# Patient Record
Sex: Female | Born: 1950 | Race: White | Hispanic: No | Smoking: Former smoker
Health system: Southern US, Community
[De-identification: ages and names within clinical notes are randomized; demographics above are authoritative.]

## PROBLEM LIST (undated history)

## (undated) DIAGNOSIS — E785 Hyperlipidemia, unspecified: Secondary | ICD-10-CM

## (undated) DIAGNOSIS — J449 Chronic obstructive pulmonary disease, unspecified: Secondary | ICD-10-CM

## (undated) DIAGNOSIS — R0600 Dyspnea, unspecified: Secondary | ICD-10-CM

## (undated) DIAGNOSIS — F329 Major depressive disorder, single episode, unspecified: Secondary | ICD-10-CM

## (undated) DIAGNOSIS — F32A Depression, unspecified: Secondary | ICD-10-CM

## (undated) DIAGNOSIS — Z9289 Personal history of other medical treatment: Secondary | ICD-10-CM

## (undated) DIAGNOSIS — F419 Anxiety disorder, unspecified: Secondary | ICD-10-CM

## (undated) DIAGNOSIS — E7800 Pure hypercholesterolemia, unspecified: Secondary | ICD-10-CM

## (undated) DIAGNOSIS — M1990 Unspecified osteoarthritis, unspecified site: Secondary | ICD-10-CM

## (undated) HISTORY — DX: Depression, unspecified: F32.A

## (undated) HISTORY — PX: REPLACEMENT TOTAL KNEE: SUR1224

## (undated) HISTORY — PX: CORRECTION HAMMER TOE: SUR317

## (undated) HISTORY — DX: Personal history of other medical treatment: Z92.89

## (undated) HISTORY — DX: Hyperlipidemia, unspecified: E78.5

## (undated) HISTORY — PX: VAGINAL HYSTERECTOMY: SUR661

## (undated) HISTORY — DX: Unspecified osteoarthritis, unspecified site: M19.90

---
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
## 2004-10-05 ENCOUNTER — Ambulatory Visit: Payer: Self-pay | Admitting: Cardiology

## 2004-10-13 ENCOUNTER — Ambulatory Visit: Payer: Self-pay | Admitting: Cardiology

## 2004-10-18 ENCOUNTER — Inpatient Hospital Stay (HOSPITAL_BASED_OUTPATIENT_CLINIC_OR_DEPARTMENT_OTHER): Admission: RE | Admit: 2004-10-18 | Discharge: 2004-10-18 | Payer: Self-pay | Admitting: Cardiology

## 2004-10-18 ENCOUNTER — Ambulatory Visit: Payer: Self-pay | Admitting: Cardiology

## 2004-10-29 ENCOUNTER — Ambulatory Visit: Payer: Self-pay | Admitting: Cardiology

## 2007-07-09 ENCOUNTER — Encounter: Payer: Self-pay | Admitting: Orthopedic Surgery

## 2007-07-09 ENCOUNTER — Ambulatory Visit: Payer: Self-pay | Admitting: Orthopedic Surgery

## 2007-07-09 DIAGNOSIS — M1710 Unilateral primary osteoarthritis, unspecified knee: Secondary | ICD-10-CM | POA: Insufficient documentation

## 2007-07-09 DIAGNOSIS — IMO0002 Reserved for concepts with insufficient information to code with codable children: Secondary | ICD-10-CM | POA: Insufficient documentation

## 2009-09-19 HISTORY — PX: REPLACEMENT TOTAL KNEE: SUR1224

## 2010-01-28 ENCOUNTER — Inpatient Hospital Stay (HOSPITAL_COMMUNITY): Admission: RE | Admit: 2010-01-28 | Discharge: 2010-01-30 | Payer: Self-pay | Admitting: Orthopedic Surgery

## 2010-10-19 NOTE — Assessment & Plan Note (Signed)
Vital Signs:  Patient Profile:   60 Years Old Female Weight:      165 pounds Temp:     98.7 degrees F Pulse rate:   78 / minute Resp:     20 per minute                 Chief Complaint:  bilateral knee pain.  History of Present Illness: I saw Victoria Adkins in the office today for an initial visit.  She is a 60 years old woman with the complaint of:  bilateral knee pain and swelling. Had surgery 1975 of rt knee due to torn cartlidge and torn ligaments. States she has to get both knees aspirated alot due to swelling. She has had this problem for over 20 years. No recent xrays. We will get xrays today in our office. She states she has alot of stiffness; has seen Dr. Arletha Grippe for these problems for over 20 yrs due to her osteoarthritis; Does not take any medicine for pain. Pain is 8 at worse, no pain now.  Summation 60 yo female s/p open right medial menisectomy  (1970's), with bilateral intermittent knee pain.  Current Allergies (reviewed today): No known allergies  Updated/Current Medications (including changes made in today's visit):  * PREMARIN 1.25MG   * EFFEXOR 150MG     Past Medical History:    Reviewed history and no changes required:       menopause       hot flashes  Past Surgical History:    Reviewed history and no changes required:       hysterectomy       rt knee    Family History:    Family History of Arthritis  Social History:    Patient is divorced.    Risk Factors:  Tobacco use:  never Alcohol use:  yes    Type:  beer    Comments:  socially   Review of Systems  The patient denies anorexia, fever, weight loss, vision loss, decreased hearing, hoarseness, chest pain, syncope, dyspnea on exhertion, peripheral edema, prolonged cough, hemoptysis, abdominal pain, melena, hematochezia, severe indigestion/heartburn, hematuria, incontinence, genital sores, muscle weakness, suspicious skin lesions, transient blindness, difficulty walking, unusual weight  change, abnormal bleeding, enlarged lymph nodes, angioedema, breast masses, and testicular masses.     Physical Exam  Pulses:     pulses normal in all 4 extremities Extremities:     no clubbing, cyanosis or edema  Neurologic:     reflexes are normal    Knee Exam  General:    Well-developed, well-nourished, in no acute distress; alert and oriented x 3.    Gait:    Normal heel-toe gait pattern bilaterally.    Skin:    Intact with no erythema; no scarring.    Inspection:    deformity: bilateral knees valgus left greater than right   Palpation:    non-tender to palpation over medial joint line, lateral joint line, parapatellar, condylar, patellar tendon, or Pes bursa. However there is PTF crepitance and poor tracking in both knees   Vascular:    dorsalis pedis and posterior tibial pulses 2+ and symmetric, capillary refill < 2 seconds, normal hair pattern, no evidence of ischemia.   Sensory:    gross sensation intact bilaterally in lower extremities.    Motor:    Motor strength 5/5 bilaterally for quadriceps, hamstrings, ankle dorsiflexion, and ankle plantar flexion.    Reflexes:    Normal and symmetric patellar and Achilles reflexes bilaterally.  Knee Exam:    Right:    Inspection:  Abnormal    Palpation:  Abnormal    Stability:  stable    Tenderness:  no    Swelling:  diffuse    Erythema:  no    Range of Motion:       Flexion-Active: 125       Extension-Active: full       Flexion-Passive: 125       Extension-Passive: full    Left:    Inspection:  Abnormal    Palpation:  Abnormal    Stability:  stable    Tenderness:  no    Swelling:  diffuse    Erythema:  no    Range of Motion:       Flexion-Active: 125       Extension-Active: full       Flexion-Passive: 125       Extension-Passive: full    Impression & Recommendations:  Problem # 1:  DEGENERATIVE JOINT DISEASE, KNEES, BILATERAL (ICD-715.96) bilateral knee x-rays were taken and we see: primarily  patellofemoral disease.  I think that she is a candidate for patellofemoral replcement or bicondylar replacement except for the age; I am asking Dr. Eulah Pont to evaluate her for PTF replacement    Medications Added to Medication List This Visit: 1)  Premarin 1.25mg   2)  Effexor 150mg     Patient Instructions: 1)  Please schedule a follow-up appointment as needed.    ]

## 2010-12-07 LAB — BASIC METABOLIC PANEL WITH GFR
BUN: 7 mg/dL (ref 6–23)
BUN: 8 mg/dL (ref 6–23)
CO2: 28 meq/L (ref 19–32)
CO2: 29 meq/L (ref 19–32)
Calcium: 8.2 mg/dL — ABNORMAL LOW (ref 8.4–10.5)
Calcium: 8.4 mg/dL (ref 8.4–10.5)
Chloride: 106 meq/L (ref 96–112)
Chloride: 106 meq/L (ref 96–112)
Creatinine, Ser: 0.64 mg/dL (ref 0.4–1.2)
Creatinine, Ser: 0.66 mg/dL (ref 0.4–1.2)
GFR calc non Af Amer: >60 mL/min
GFR calc non Af Amer: >60 mL/min
Glucose, Bld: 134 mg/dL — ABNORMAL HIGH (ref 70–99)
Glucose, Bld: 95 mg/dL (ref 70–99)
Potassium: 4 meq/L (ref 3.5–5.1)
Potassium: 4.8 meq/L (ref 3.5–5.1)
Sodium: 138 meq/L (ref 135–145)
Sodium: 139 meq/L (ref 135–145)

## 2010-12-07 LAB — URINALYSIS, ROUTINE W REFLEX MICROSCOPIC
Bilirubin Urine: NEGATIVE
Bilirubin Urine: NEGATIVE
Glucose, UA: NEGATIVE mg/dL
Glucose, UA: NEGATIVE mg/dL
Ketones, ur: NEGATIVE mg/dL
Ketones, ur: NEGATIVE mg/dL
Leukocytes, UA: NEGATIVE
Leukocytes, UA: NEGATIVE
Nitrite: NEGATIVE
Nitrite: NEGATIVE
Protein, ur: NEGATIVE mg/dL
Protein, ur: NEGATIVE mg/dL
Specific Gravity, Urine: 1.013 (ref 1.005–1.030)
Specific Gravity, Urine: 1.025 (ref 1.005–1.030)
Urobilinogen, UA: 0.2 mg/dL (ref 0.0–1.0)
Urobilinogen, UA: 0.2 mg/dL (ref 0.0–1.0)
pH: 5.5 (ref 5.0–8.0)
pH: 6 (ref 5.0–8.0)

## 2010-12-07 LAB — CBC
HCT: 25 % — ABNORMAL LOW (ref 36.0–46.0)
HCT: 29.5 % — ABNORMAL LOW (ref 36.0–46.0)
HCT: 41.6 % (ref 36.0–46.0)
Hemoglobin: 10.2 g/dL — ABNORMAL LOW (ref 12.0–15.0)
Hemoglobin: 14.2 g/dL (ref 12.0–15.0)
Hemoglobin: 8.5 g/dL — ABNORMAL LOW (ref 12.0–15.0)
MCHC: 33.9 g/dL (ref 30.0–36.0)
MCHC: 34.2 g/dL (ref 30.0–36.0)
MCHC: 34.5 g/dL (ref 30.0–36.0)
MCV: 89.4 fL (ref 78.0–100.0)
MCV: 89.5 fL (ref 78.0–100.0)
MCV: 90.6 fL (ref 78.0–100.0)
Platelets: 121 10*3/uL — ABNORMAL LOW (ref 150–400)
Platelets: 132 10*3/uL — ABNORMAL LOW (ref 150–400)
Platelets: 143 10*3/uL — ABNORMAL LOW (ref 150–400)
RBC: 2.76 MIL/uL — ABNORMAL LOW (ref 3.87–5.11)
RBC: 3.3 MIL/uL — ABNORMAL LOW (ref 3.87–5.11)
RBC: 4.65 MIL/uL (ref 3.87–5.11)
RDW: 13.2 % (ref 11.5–15.5)
RDW: 13.4 % (ref 11.5–15.5)
RDW: 13.5 % (ref 11.5–15.5)
WBC: 10.7 10*3/uL — ABNORMAL HIGH (ref 4.0–10.5)
WBC: 13.1 10*3/uL — ABNORMAL HIGH (ref 4.0–10.5)
WBC: 8.6 10*3/uL (ref 4.0–10.5)

## 2010-12-07 LAB — URINE MICROSCOPIC-ADD ON

## 2010-12-07 LAB — CROSSMATCH
ABO/RH(D): A NEG
Antibody Screen: NEGATIVE

## 2010-12-07 LAB — COMPREHENSIVE METABOLIC PANEL WITH GFR
ALT: 14 U/L (ref 0–35)
AST: 16 U/L (ref 0–37)
Albumin: 3.6 g/dL (ref 3.5–5.2)
Alkaline Phosphatase: 65 U/L (ref 39–117)
BUN: 10 mg/dL (ref 6–23)
CO2: 29 meq/L (ref 19–32)
Calcium: 9 mg/dL (ref 8.4–10.5)
Chloride: 106 meq/L (ref 96–112)
Creatinine, Ser: 0.84 mg/dL (ref 0.4–1.2)
GFR calc non Af Amer: >60 mL/min
Glucose, Bld: 109 mg/dL — ABNORMAL HIGH (ref 70–99)
Potassium: 4 meq/L (ref 3.5–5.1)
Sodium: 140 meq/L (ref 135–145)
Total Bilirubin: 0.5 mg/dL (ref 0.3–1.2)
Total Protein: 6.3 g/dL (ref 6.0–8.3)

## 2010-12-07 LAB — DIFFERENTIAL
Basophils Absolute: 0.1 10*3/uL (ref 0.0–0.1)
Basophils Relative: 1 % (ref 0–1)
Eosinophils Absolute: 0.1 10*3/uL (ref 0.0–0.7)
Eosinophils Relative: 2 % (ref 0–5)
Lymphocytes Relative: 18 % (ref 12–46)
Lymphs Abs: 1.6 10*3/uL (ref 0.7–4.0)
Monocytes Absolute: 0.4 10*3/uL (ref 0.1–1.0)
Monocytes Relative: 5 % (ref 3–12)
Neutro Abs: 6.4 10*3/uL (ref 1.7–7.7)
Neutrophils Relative %: 75 % (ref 43–77)

## 2010-12-07 LAB — URINE CULTURE
Colony Count: NO GROWTH
Culture: NO GROWTH
Special Requests: NEGATIVE

## 2010-12-07 LAB — APTT: aPTT: 29 s (ref 24–37)

## 2010-12-07 LAB — PROTIME-INR
INR: 1.02 (ref 0.00–1.49)
Prothrombin Time: 13.3 s (ref 11.6–15.2)

## 2010-12-07 LAB — ABO/RH: ABO/RH(D): A NEG

## 2011-01-17 DIAGNOSIS — R079 Chest pain, unspecified: Secondary | ICD-10-CM

## 2011-02-04 NOTE — Cardiovascular Report (Signed)
NAMEAPOORVA, BUGAY NO.:  [id number]   MEDICAL RECORD NO.:  [id number]          PATIENT TYPE:  OIB   LOCATION:  6501                         FACILITY:  [facility]   PHYSICIAN:  Salvadore Farber, M.D. LHCDATE OF BIRTH:  11-Nov-1950   DATE OF PROCEDURE:  10/18/2004  DATE OF DISCHARGE:                              CARDIAC CATHETERIZATION   PROCEDURE:  Left heart catheterization, left ventriculography, coronary  angiography.   CARDIOLOGIST:  Salvadore Farber, M.D.   INDICATIONS FOR PROCEDURE:  The patient is a 60 year old lady with cardiac  risk factors of prior tobacco abuse and diet-controlled dyslipidemia, who  presents with two to three months of intermittent chest tightness.  An HD  Cardiolite showed no electrocardiographic abnormality.  There was, however,  a small partially-reversible anteroapical defect consistent with ischemia.  The ejection fraction was 64%.  She was referred for a diagnostic  angiography, to exclude coronary artery disease as the etiology of her chest  discomfort and abnormal Cardiolite.   DESCRIPTION OF PROCEDURE:  An informed consent was obtained.  Under 1%  lidocaine local anesthesia, a 4-French sheath was placed in the right common  femoral artery using the modified Seldinger technique.  A diagnostic  angiography was performed using a JL4 catheter for the native left, AL1 for  the native right and a pigtail catheter for the left heart catheterization  and ventriculography.  The sheaths are removed and manual compression  applied.  The patient tolerated the procedure well and was transferred to  the holding room in stable condition.   COMPLICATIONS:  None.   FINDINGS:  LVE:  137/2/10.  EJECTION FRACTION:  65% without regional wall motion abnormality.  No aortic  stenosis or mitral regurgitation.   RESULTS:  1.  LEFT MAIN CORONARY ARTERY:  The left main coronary artery is normal.  2.  LEFT ANTERIOR DESCENDING CORONARY ARTERY:  The  left anterior descending      coronary artery is a moderate-sized vessel giving rise to a single small      diagonal.  It is angiographically normal.  3.  RAMUS INTERMEDIUS:  The ramus intermedius is a large branching vessel      which is angiographically normal.  4.  CIRCUMFLEX CORONARY ARTERY:  The circumflex coronary artery is a      moderate-sized vessel giving rise to a single obtuse marginal.  There is      a 30% stenosis at its ostium.  5.  RIGHT CORONARY ARTERY:  The right coronary artery is very high in      anterior takeoff near the left main.  The vessel is angiographically      normal.   IMPRESSION/RECOMMENDATIONS:  Very mild non-obstructive coronary artery  disease.  This is not likely responsible for her chest pain.  Continue  aspirin.  The patient is to follow up with Dr. Reuel Boom regarding her chest  discomfort.      WED/MEDQ  D:  10/18/2004  T:  10/18/2004  Job:  [id number]   cc:   Jonelle Sidle, M.D. [facility]   Dr. Lyanne Co,  [place]

## 2016-07-29 ENCOUNTER — Encounter (INDEPENDENT_AMBULATORY_CARE_PROVIDER_SITE_OTHER): Payer: Self-pay

## 2016-07-29 ENCOUNTER — Encounter (INDEPENDENT_AMBULATORY_CARE_PROVIDER_SITE_OTHER): Payer: Self-pay | Admitting: Internal Medicine

## 2016-08-08 ENCOUNTER — Encounter (INDEPENDENT_AMBULATORY_CARE_PROVIDER_SITE_OTHER): Payer: Self-pay

## 2016-08-08 ENCOUNTER — Ambulatory Visit (INDEPENDENT_AMBULATORY_CARE_PROVIDER_SITE_OTHER): Payer: Commercial Managed Care - HMO | Admitting: Internal Medicine

## 2016-08-08 ENCOUNTER — Encounter (INDEPENDENT_AMBULATORY_CARE_PROVIDER_SITE_OTHER): Payer: Self-pay | Admitting: Internal Medicine

## 2016-08-08 VITALS — BP 140/72 | HR 64 | Temp 97.6°F | Ht 67.0 in | Wt 191.1 lb

## 2016-08-08 DIAGNOSIS — R195 Other fecal abnormalities: Secondary | ICD-10-CM | POA: Diagnosis not present

## 2016-08-08 DIAGNOSIS — R197 Diarrhea, unspecified: Secondary | ICD-10-CM | POA: Insufficient documentation

## 2016-08-08 DIAGNOSIS — K580 Irritable bowel syndrome with diarrhea: Secondary | ICD-10-CM

## 2016-08-08 DIAGNOSIS — M1990 Unspecified osteoarthritis, unspecified site: Secondary | ICD-10-CM | POA: Insufficient documentation

## 2016-08-08 DIAGNOSIS — K589 Irritable bowel syndrome without diarrhea: Secondary | ICD-10-CM | POA: Insufficient documentation

## 2016-08-08 HISTORY — DX: Unspecified osteoarthritis, unspecified site: M19.90

## 2016-08-08 LAB — CBC WITH DIFFERENTIAL/PLATELET
Basophils Absolute: 0 {cells}/uL (ref 0–200)
Basophils Relative: 0 %
Eosinophils Absolute: 243 {cells}/uL (ref 15–500)
Eosinophils Relative: 3 %
HCT: 38.5 % (ref 35.0–45.0)
Hemoglobin: 13.1 g/dL (ref 11.7–15.5)
Lymphocytes Relative: 19 %
Lymphs Abs: 1539 {cells}/uL (ref 850–3900)
MCH: 29.4 pg (ref 27.0–33.0)
MCHC: 34 g/dL (ref 32.0–36.0)
MCV: 86.3 fL (ref 80.0–100.0)
MPV: 11.2 fL (ref 7.5–12.5)
Monocytes Absolute: 648 {cells}/uL (ref 200–950)
Monocytes Relative: 8 %
Neutro Abs: 5670 {cells}/uL (ref 1500–7800)
Neutrophils Relative %: 70 %
Platelets: 153 10*3/uL (ref 140–400)
RBC: 4.46 MIL/uL (ref 3.80–5.10)
RDW: 13.5 % (ref 11.0–15.0)
WBC: 8.1 10*3/uL (ref 3.8–10.8)

## 2016-08-08 MED ORDER — DICYCLOMINE HCL 10 MG PO CAPS: 10.0000 mg | ORAL_CAPSULE | Freq: Three times a day (TID) | ORAL | 2 refills | Status: DC

## 2016-08-08 NOTE — Progress Notes (Addendum)
   Subjective:    Patient ID: Victoria Adkins, female    DOB: 1951-05-01, 65 y.o.   MRN: [id number]  HPI Referred by Dr. Donzetta Sprungerry Daniel for abdominal pain/diarrhea. She has fecal urgency when she eats within 10 minutes. Symptoms x 1 month. She feels gassy.  She averages about 3-4 stools a day and are loose. Stools are not diarrhea.  Appetite is good. No weight loss. Recently treated for H .pylori (10 days ago). Treated with Clarithromycin, Metronidazole, Omeprazole and Amoxicillin. She denies any heartburn.  She has no upper abdominal pain.  No NSAIDS. She takes Aleve x 1, three to four times a weeks.  She says she had a positive stool card recently at her PCP.  No recent antibiotics except one presently taking for her H. Pylori.   Last colonoscopy was 2015 (Average risk) by Dr. Marcha Soldersathey. Polyp at 100cm, 60cm, two at 30 cm, one at rectum. Retroflexed revealed internal hemorrhoids. Biopsy: Three polyps were tubular adenomas. One hyperplastic,> One fibroepithelial polyp.    Review of Systems     Past Medical History:  Diagnosis Date  . Arthritis 08/08/2016    No past surgical history on file.  Allergies no known allergies  No current outpatient prescriptions on file prior to visit.   No current facility-administered medications on file prior to visit.    Current Outpatient Prescriptions  Medication Sig Dispense Refill  . naproxen sodium (ANAPROX) 220 MG tablet Take 220 mg by mouth 2 (two) times daily with a meal.    . sertraline (ZOLOFT) 50 MG tablet Take 50 mg by mouth daily.     No current facility-administered medications for this visit.     Objective:   Physical Exam Blood pressure 140/72, pulse 64, temperature 97.6 F (36.4 C), height 5\' 7"  (1.702 m), weight 191 lb 1.6 oz (86.7 kg).  Alert and oriented. Skin warm and dry. Oral mucosa is moist.   . Sclera anicteric, conjunctivae is pink. Thyroid not enlarged. No cervical lymphadenopathy. Lungs clear. Heart regular rate and  rhythm.  Abdomen is soft. Bowel sounds are positive. No hepatomegaly. No abdominal masses felt. No tenderness.  No edema to lower extremities.        Assessment & Plan:  Possible IBS. Am going to start her own Dicyclomine 10mg  tid.  CBC today.  3 stools cards home with patient.  OV in 4 weeks.

## 2016-08-08 NOTE — Patient Instructions (Signed)
OV in 4 weeks.  Dicyclomine 10mg  TID.

## 2016-08-10 ENCOUNTER — Telehealth (INDEPENDENT_AMBULATORY_CARE_PROVIDER_SITE_OTHER): Payer: Self-pay | Admitting: Internal Medicine

## 2016-08-10 NOTE — Telephone Encounter (Signed)
Patient called, stated that you wanted her to provide stool samples.  She stated that her labs were normal, so she wants to know if you still want her to provide stool samples.  [phone]

## 2016-08-10 NOTE — Telephone Encounter (Signed)
I called the patient to let her know that Camelia Engerri still wants her to do the stool samples.

## 2016-08-10 NOTE — Telephone Encounter (Signed)
Let her know I am checking for blood.

## 2016-08-16 ENCOUNTER — Telehealth (INDEPENDENT_AMBULATORY_CARE_PROVIDER_SITE_OTHER): Payer: Self-pay | Admitting: *Deleted

## 2016-08-16 DIAGNOSIS — R197 Diarrhea, unspecified: Secondary | ICD-10-CM | POA: Diagnosis not present

## 2016-08-16 DIAGNOSIS — R195 Other fecal abnormalities: Secondary | ICD-10-CM | POA: Diagnosis not present

## 2016-08-16 NOTE — Telephone Encounter (Signed)
   Diagnosis:    Result(s)   Card 1: Negative: 08/14/2016    Card 2:  Negative:08/15/2016   Card 3:Negative:08/16/2016    Completed by:    HEMOCCULT SENSA DEVELOPER: UJW#:[id number]#:[id number]   EXPIRATION DATE: 2020-05   HEMOCCULT SENSA CARD:  WGN#:[id number]#:[id number]   EXPIRATION DATE: 01/2019   CARD CONTROL RESULTS:  POSITIVE:Positive  NEGATIVE: Negative    ADDITIONAL COMMENTS: Forwarded to Terri for review.

## 2016-08-18 NOTE — Telephone Encounter (Signed)
Results given to patient. OV in a few weeks

## 2016-09-05 ENCOUNTER — Ambulatory Visit (INDEPENDENT_AMBULATORY_CARE_PROVIDER_SITE_OTHER): Payer: Commercial Managed Care - HMO | Admitting: Internal Medicine

## 2016-09-06 ENCOUNTER — Encounter (INDEPENDENT_AMBULATORY_CARE_PROVIDER_SITE_OTHER): Payer: Self-pay | Admitting: Internal Medicine

## 2016-09-06 ENCOUNTER — Ambulatory Visit (INDEPENDENT_AMBULATORY_CARE_PROVIDER_SITE_OTHER): Payer: Commercial Managed Care - HMO | Admitting: Internal Medicine

## 2016-09-06 VITALS — BP 134/72 | HR 72 | Temp 97.4°F | Ht 67.0 in | Wt 195.9 lb

## 2016-09-06 DIAGNOSIS — K588 Other irritable bowel syndrome: Secondary | ICD-10-CM | POA: Diagnosis not present

## 2016-09-06 NOTE — Patient Instructions (Signed)
Continue the Dicyclomine QID. OV 6 months

## 2016-09-06 NOTE — Progress Notes (Signed)
   Subjective:    Patient ID: Victoria Adkins, female    DOB: Jan 03, 1951, 65 y.o.   MRN: [id number]  HPI  Here today for f/u. She was last seen in November for abdominal pain/diarrhea.    She has fecal urgency when she eats within 10 minutes. Symptoms x 2 months. She feels gassy.  She averages about 2-3 stools a day and are formed but narrow and stringy.  Appetite is good. No weight loss. Recently treated for H .pylori (10 days ago). Treated with Clarithromycin, Metronidazole, Omeprazole and Amoxicillin. She denies any heartburn.  She has no upper abdominal pain.  No NSAIDS. She takes Aleve x 1, three to four times a weeks.  She says she had a positive stool card recently at her PCP.   She has gained 5 pounds.  3 stool cards sent home with patient were negative.   Last colonoscopy was 2015 (Average risk) by Dr. Marcha Soldersathey. Polyp at 100cm, 60cm, two at 30 cm, one at rectum. Retroflexed revealed internal hemorrhoids. Biopsy: Three polyps were tubular adenomas. One hyperplastic,> One fibroepithelial polyp.     Review of Systems Past Medical History:  Diagnosis Date  . Arthritis 08/08/2016    Past Surgical History:  Procedure Laterality Date  . REPLACEMENT TOTAL KNEE     2011 rt knee.     No Known Allergies  Current Outpatient Prescriptions on File Prior to Visit  Medication Sig Dispense Refill  . dicyclomine (BENTYL) 10 MG capsule Take 1 capsule (10 mg total) by mouth 3 (three) times daily before meals. 90 capsule 2  . sertraline (ZOLOFT) 50 MG tablet Take 50 mg by mouth daily.     No current facility-administered medications on file prior to visit.        Objective:   Physical Exam Blood pressure 134/72, pulse 72, temperature 97.4 F (36.3 C), height 5\' 7"  (1.702 m), weight 195 lb 14.4 oz (88.9 kg). Alert and oriented. Skin warm and dry. Oral mucosa is moist.   . Sclera anicteric, conjunctivae is pink. Thyroid not enlarged. No cervical lymphadenopathy. Lungs clear. Heart  regular rate and rhythm.  Abdomen is soft. Bowel sounds are positive. No hepatomegaly. No abdominal masses felt. No tenderness.  No edema to lower extremities.          Assessment & Plan:  Probably IBS. Increase the Dicyclomine to four times a day. OV in 3 months.

## 2016-12-05 ENCOUNTER — Ambulatory Visit (INDEPENDENT_AMBULATORY_CARE_PROVIDER_SITE_OTHER): Payer: Commercial Managed Care - HMO | Admitting: Internal Medicine

## 2016-12-24 ENCOUNTER — Other Ambulatory Visit (INDEPENDENT_AMBULATORY_CARE_PROVIDER_SITE_OTHER): Payer: Self-pay | Admitting: Internal Medicine

## 2016-12-24 DIAGNOSIS — K580 Irritable bowel syndrome with diarrhea: Secondary | ICD-10-CM
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
## 2018-04-11 NOTE — Patient Instructions (Signed)
Your procedure is scheduled on: 04/20/2018  Report to [facility] at   710   AM.  Call this number if you have problems the morning of surgery: [phone]   Do not eat food or drink liquids :After Midnight.      Take these medicines the morning of surgery with A SIP OF WATER: mobic, zoloft. Use your inhaler before you come.   Do not wear jewelry, make-up or nail polish.  Do not wear lotions, powders, or perfumes. You may wear deodorant.  Do not shave 48 hours prior to surgery.  Do not bring valuables to the hospital.  Contacts, dentures or bridgework may not be worn into surgery.  Leave suitcase in the car. After surgery it may be brought to your room.  For patients admitted to the hospital, checkout time is 11:00 AM the day of discharge.   Patients discharged the day of surgery will not be allowed to drive home.  :     Please read over the following fact sheets that you were given: Coughing and Deep Breathing, Surgical Site Infection Prevention, Anesthesia Post-op Instructions and Care and Recovery After Surgery    Cataract A cataract is a clouding of the lens of the eye. When a lens becomes cloudy, vision is reduced based on the degree and nature of the clouding. Many cataracts reduce vision to some degree. Some cataracts make people more near-sighted as they develop. Other cataracts increase glare. Cataracts that are ignored and become worse can sometimes look white. The white color can be seen through the pupil. CAUSES   Aging. However, cataracts may occur at any age, even in newborns.   Certain drugs.   Trauma to the eye.   Certain diseases such as diabetes.   Specific eye diseases such as chronic inflammation inside the eye or a sudden attack of a rare form of glaucoma.   Inherited or acquired medical problems.  SYMPTOMS   Gradual, progressive drop in vision in the affected eye.   Severe, rapid visual loss. This most often happens when trauma is the cause.  DIAGNOSIS  To  detect a cataract, an eye doctor examines the lens. Cataracts are best diagnosed with an exam of the eyes with the pupils enlarged (dilated) by drops.  TREATMENT  For an early cataract, vision may improve by using different eyeglasses or stronger lighting. If that does not help your vision, surgery is the only effective treatment. A cataract needs to be surgically removed when vision loss interferes with your everyday activities, such as driving, reading, or watching TV. A cataract may also have to be removed if it prevents examination or treatment of another eye problem. Surgery removes the cloudy lens and usually replaces it with a substitute lens (intraocular lens, IOL).  At a time when both you and your doctor agree, the cataract will be surgically removed. If you have cataracts in both eyes, only one is usually removed at a time. This allows the operated eye to heal and be out of danger from any possible problems after surgery (such as infection or poor wound healing). In rare cases, a cataract may be doing damage to your eye. In these cases, your caregiver may advise surgical removal right away. The vast majority of people who have cataract surgery have better vision afterward. HOME CARE INSTRUCTIONS  If you are not planning surgery, you may be asked to do the following:  Use different eyeglasses.   Use stronger or brighter lighting.   Ask  your eye doctor about reducing your medicine dose or changing medicines if it is thought that a medicine caused your cataract. Changing medicines does not make the cataract go away on its own.   Become familiar with your surroundings. Poor vision can lead to injury. Avoid bumping into things on the affected side. You are at a higher risk for tripping or falling.   Exercise extreme care when driving or operating machinery.   Wear sunglasses if you are sensitive to bright light or experiencing problems with glare.  SEEK IMMEDIATE MEDICAL CARE IF:   You have  a worsening or sudden vision loss.   You notice redness, swelling, or increasing pain in the eye.   You have a fever.  Document Released: 09/05/2005 Document Revised: 08/25/2011 Document Reviewed: 04/29/2011 [facility] Patient Information 2012 [place], [place].PATIENT INSTRUCTIONS POST-ANESTHESIA  IMMEDIATELY FOLLOWING SURGERY:  Do not drive or operate machinery for the first twenty four hours after surgery.  Do not make any important decisions for twenty four hours after surgery or while taking narcotic pain medications or sedatives.  If you develop intractable nausea and vomiting or a severe headache please notify your doctor immediately.  FOLLOW-UP:  Please make an appointment with your surgeon as instructed. You do not need to follow up with anesthesia unless specifically instructed to do so.  WOUND CARE INSTRUCTIONS (if applicable):  Keep a dry clean dressing on the anesthesia/puncture wound site if there is drainage.  Once the wound has quit draining you may leave it open to air.  Generally you should leave the bandage intact for twenty four hours unless there is drainage.  If the epidural site drains for more than 36-48 hours please call the anesthesia department.  QUESTIONS?:  Please feel free to call your physician or the hospital operator if you have any questions, and they will be happy to assist you.

## 2018-04-12 ENCOUNTER — Encounter (HOSPITAL_COMMUNITY): Payer: Self-pay | Admitting: Psychiatry

## 2018-04-12 ENCOUNTER — Ambulatory Visit (INDEPENDENT_AMBULATORY_CARE_PROVIDER_SITE_OTHER): Payer: Medicare HMO | Admitting: Psychiatry

## 2018-04-12 VITALS — BP 107/65 | HR 70 | Ht 67.0 in | Wt 203.0 lb

## 2018-04-12 DIAGNOSIS — F331 Major depressive disorder, recurrent, moderate: Secondary | ICD-10-CM

## 2018-04-12 DIAGNOSIS — E785 Hyperlipidemia, unspecified: Secondary | ICD-10-CM | POA: Insufficient documentation

## 2018-04-12 MED ORDER — TRAZODONE HCL 50 MG PO TABS: 50.0000 mg | ORAL_TABLET | Freq: Every day | ORAL | 0 refills | Status: DC

## 2018-04-12 MED ORDER — SERTRALINE HCL 100 MG PO TABS: 200.0000 mg | ORAL_TABLET | Freq: Every day | ORAL | 0 refills | Status: DC

## 2018-04-12 NOTE — Patient Instructions (Signed)
1. Increase sertraline 200 mg daily  2. Continue Trazodone 50 mg at night as needed for sleep 2. Referral to thrapy  3. Consider IOP  4. Return to clinic in one month for 30 mins

## 2018-04-13 ENCOUNTER — Other Ambulatory Visit: Payer: Self-pay

## 2018-04-13 ENCOUNTER — Encounter (HOSPITAL_COMMUNITY): Payer: Self-pay

## 2018-04-13 ENCOUNTER — Encounter (HOSPITAL_COMMUNITY)
Admission: RE | Admit: 2018-04-13 | Discharge: 2018-04-13 | Disposition: A | Discharge status: Home / Self Care | Payer: Medicare HMO | Source: Ambulatory Visit | Attending: Ophthalmology | Admitting: Ophthalmology

## 2018-04-13 DIAGNOSIS — Z01812 Encounter for preprocedural laboratory examination: Secondary | ICD-10-CM | POA: Insufficient documentation

## 2018-04-13 DIAGNOSIS — Z01810 Encounter for preprocedural cardiovascular examination: Secondary | ICD-10-CM | POA: Insufficient documentation

## 2018-04-13 HISTORY — DX: Anxiety disorder, unspecified: F41.9

## 2018-04-13 HISTORY — DX: Dyspnea, unspecified: R06.00

## 2018-04-13 HISTORY — DX: Pure hypercholesterolemia, unspecified: E78.00

## 2018-04-13 LAB — CBC
HCT: 40.7 % (ref 36.0–46.0)
Hemoglobin: 13.3 g/dL (ref 12.0–15.0)
MCH: 29.9 pg (ref 26.0–34.0)
MCHC: 32.7 g/dL (ref 30.0–36.0)
MCV: 91.5 fL (ref 78.0–100.0)
Platelets: 123 10*3/uL — ABNORMAL LOW (ref 150–400)
RBC: 4.45 MIL/uL (ref 3.87–5.11)
RDW: 12.7 % (ref 11.5–15.5)
WBC: 8.2 10*3/uL (ref 4.0–10.5)

## 2018-04-13 LAB — BASIC METABOLIC PANEL WITH GFR
Anion gap: 8 (ref 5–15)
BUN: 15 mg/dL (ref 8–23)
CO2: 27 mmol/L (ref 22–32)
Calcium: 9.2 mg/dL (ref 8.9–10.3)
Chloride: 104 mmol/L (ref 98–111)
Creatinine, Ser: 0.85 mg/dL (ref 0.44–1.00)
GFR calc Af Amer: >60 mL/min
GFR calc non Af Amer: >60 mL/min
Glucose, Bld: 91 mg/dL (ref 70–99)
Potassium: 4.4 mmol/L (ref 3.5–5.1)
Sodium: 139 mmol/L (ref 135–145)

## 2018-04-20 ENCOUNTER — Other Ambulatory Visit: Payer: Self-pay

## 2018-04-20 ENCOUNTER — Encounter (HOSPITAL_COMMUNITY)
Admission: RE | Disposition: A | Discharge status: Home / Self Care | Payer: Self-pay | Source: Ambulatory Visit | Attending: Ophthalmology

## 2018-04-20 ENCOUNTER — Ambulatory Visit (HOSPITAL_COMMUNITY): Payer: Medicare HMO | Admitting: Anesthesiology

## 2018-04-20 ENCOUNTER — Ambulatory Visit (HOSPITAL_COMMUNITY)
Admission: RE | Admit: 2018-04-20 | Discharge: 2018-04-20 | Disposition: A | Discharge status: Home / Self Care | Payer: Medicare HMO | Source: Ambulatory Visit | Attending: Ophthalmology | Admitting: Ophthalmology

## 2018-04-20 ENCOUNTER — Encounter (HOSPITAL_COMMUNITY): Payer: Self-pay | Admitting: *Deleted

## 2018-04-20 DIAGNOSIS — Z79899 Other long term (current) drug therapy: Secondary | ICD-10-CM | POA: Insufficient documentation

## 2018-04-20 DIAGNOSIS — F329 Major depressive disorder, single episode, unspecified: Secondary | ICD-10-CM | POA: Diagnosis not present

## 2018-04-20 DIAGNOSIS — F419 Anxiety disorder, unspecified: Secondary | ICD-10-CM | POA: Diagnosis not present

## 2018-04-20 DIAGNOSIS — M1990 Unspecified osteoarthritis, unspecified site: Secondary | ICD-10-CM | POA: Insufficient documentation

## 2018-04-20 DIAGNOSIS — Z96659 Presence of unspecified artificial knee joint: Secondary | ICD-10-CM | POA: Diagnosis not present

## 2018-04-20 DIAGNOSIS — Z87891 Personal history of nicotine dependence: Secondary | ICD-10-CM | POA: Insufficient documentation

## 2018-04-20 DIAGNOSIS — R0600 Dyspnea, unspecified: Secondary | ICD-10-CM | POA: Insufficient documentation

## 2018-04-20 DIAGNOSIS — H269 Unspecified cataract: Secondary | ICD-10-CM | POA: Insufficient documentation

## 2018-04-20 HISTORY — PX: CATARACT EXTRACTION W/PHACO: SHX586

## 2018-04-20 SURGERY — CATARACT EXTRACTION PHACO AND INTRAOCULAR LENS PLACEMENT (IOC)
Anesthesia: Monitor Anesthesia Care | Site: Eye | Laterality: Left

## 2018-04-20 MED ORDER — FENTANYL CITRATE (PF) 100 MCG/2ML IJ SOLN
INTRAMUSCULAR | Status: AC
  Filled 2018-04-20: qty 2

## 2018-04-20 MED ORDER — NEOMYCIN-POLYMYXIN-DEXAMETH 3.5-10000-0.1 OP SUSP
OPHTHALMIC | Status: DC | PRN
  Administered 2018-04-20: 2 [drp] via OPHTHALMIC

## 2018-04-20 MED ORDER — EPINEPHRINE PF 1 MG/ML IJ SOLN
INTRAOCULAR | Status: DC | PRN
  Administered 2018-04-20: 500 mL

## 2018-04-20 MED ORDER — FENTANYL CITRATE (PF) 100 MCG/2ML IJ SOLN
INTRAMUSCULAR | Status: DC | PRN
  Administered 2018-04-20: 100 ug via INTRAVENOUS

## 2018-04-20 MED ORDER — BSS IO SOLN
INTRAOCULAR | Status: DC | PRN
  Administered 2018-04-20: 15 mL

## 2018-04-20 MED ORDER — MIDAZOLAM HCL 5 MG/5ML IJ SOLN
INTRAMUSCULAR | Status: DC | PRN
  Administered 2018-04-20: 2 mg via INTRAVENOUS

## 2018-04-20 MED ORDER — PHENYLEPHRINE HCL 2.5 % OP SOLN
1.0000 [drp] | OPHTHALMIC | Status: AC
  Administered 2018-04-20 (×3): 1 [drp] via OPHTHALMIC

## 2018-04-20 MED ORDER — SODIUM HYALURONATE 23 MG/ML IO SOLN
INTRAOCULAR | Status: DC | PRN
  Administered 2018-04-20: 0.6 mL via INTRAOCULAR

## 2018-04-20 MED ORDER — LIDOCAINE HCL (PF) 1 % IJ SOLN
INTRAOCULAR | Status: DC | PRN
  Administered 2018-04-20: 1 mL via OPHTHALMIC

## 2018-04-20 MED ORDER — PROVISC 10 MG/ML IO SOLN
INTRAOCULAR | Status: DC | PRN
  Administered 2018-04-20: 0.85 mL via INTRAOCULAR

## 2018-04-20 MED ORDER — CYCLOPENTOLATE-PHENYLEPHRINE 0.2-1 % OP SOLN
1.0000 [drp] | OPHTHALMIC | Status: AC | PRN
  Administered 2018-04-20 (×3): 1 [drp] via OPHTHALMIC

## 2018-04-20 MED ORDER — LIDOCAINE HCL 3.5 % OP GEL
1.0000 "application " | Freq: Once | OPHTHALMIC | Status: AC
  Administered 2018-04-20: 1 "application " via OPHTHALMIC

## 2018-04-20 MED ORDER — POVIDONE-IODINE 5 % OP SOLN
OPHTHALMIC | Status: DC | PRN
  Administered 2018-04-20: 1 "application " via OPHTHALMIC

## 2018-04-20 MED ORDER — MIDAZOLAM HCL 2 MG/2ML IJ SOLN
INTRAMUSCULAR | Status: AC
  Filled 2018-04-20: qty 2

## 2018-04-20 MED ORDER — LACTATED RINGERS IV SOLN
INTRAVENOUS | Status: DC
  Administered 2018-04-20: 09:00:00 via INTRAVENOUS

## 2018-04-20 MED ORDER — TETRACAINE HCL 0.5 % OP SOLN
1.0000 [drp] | OPHTHALMIC | Status: AC
  Administered 2018-04-20 (×3): 1 [drp] via OPHTHALMIC

## 2018-04-20 SURGICAL SUPPLY — 13 items
CLOTH BEACON ORANGE TIMEOUT ST (SAFETY) ×2
EYE SHIELD UNIVERSAL CLEAR (GAUZE/BANDAGES/DRESSINGS) ×2
GLOVE BIOGEL PI IND STRL 7.0 (GLOVE) ×2
GLOVE BIOGEL PI INDICATOR 7.0 (GLOVE) ×2
LENS ALC ACRYL/TECN (Ophthalmic Related) ×2 IMPLANT
NDL HYPO 18GX1.5 BLUNT FILL (NEEDLE) IMPLANT
NEEDLE HYPO 18GX1.5 BLUNT FILL (NEEDLE) ×2
PAD ARMBOARD 7.5X6 YLW CONV (MISCELLANEOUS) ×2
SYR TB 1ML LL NO SAFETY (SYRINGE) ×2
TAPE SURG TRANSPORE 1 IN (GAUZE/BANDAGES/DRESSINGS) ×1
TAPE SURGICAL TRANSPORE 1 IN (GAUZE/BANDAGES/DRESSINGS) ×1
VISCOELASTIC ADDITIONAL (OPHTHALMIC RELATED) ×2
WATER STERILE IRR 250ML POUR (IV SOLUTION) ×2

## 2018-04-20 NOTE — Transfer of Care (Signed)
Immediate Anesthesia Transfer of Care Note  Patient: Victoria Adkins  Procedure(s) Performed: CATARACT EXTRACTION PHACO AND INTRAOCULAR LENS PLACEMENT (IOC) (Left Eye)  Patient Location: PACU  Anesthesia Type:MAC  Level of Consciousness: awake, alert  and oriented  Airway & Oxygen Therapy: Patient Spontanous Breathing and Patient connected to nasal cannula oxygen  Post-op Assessment: Report given to RN and Post -op Vital signs reviewed and stable  Post vital signs: Reviewed and stable  Last Vitals:  Vitals Value Taken Time  BP    Temp    Pulse    Resp    SpO2      Last Pain:  Vitals:   04/20/18 0741  TempSrc: Oral  PainSc: 0-No pain      Patients Stated Pain Goal: 9 ([id number])  Complications: No apparent anesthesia complications

## 2018-04-20 NOTE — Anesthesia Preprocedure Evaluation (Signed)
Anesthesia Evaluation  Patient identified by MRN, date of birth, ID band Patient awake    Reviewed: Allergy & Precautions, H&P , NPO status , Patient's Chart, lab work & pertinent test results, reviewed documented beta blocker date and time   Airway Mallampati: II  TM Distance: >3 FB Neck ROM: full    Dental no notable dental hx. (+) Dental Advidsory Given   Pulmonary neg pulmonary ROS, shortness of breath and with exertion, former smoker,    Pulmonary exam normal breath sounds clear to auscultation       Cardiovascular Exercise Tolerance: Good negative cardio ROS   Rhythm:regular Rate:Normal     Neuro/Psych PSYCHIATRIC DISORDERS Anxiety negative neurological ROS  negative psych ROS   GI/Hepatic negative GI ROS, Neg liver ROS,   Endo/Other  negative endocrine ROS  Renal/GU negative Renal ROS  negative genitourinary   Musculoskeletal   Abdominal   Peds  Hematology negative hematology ROS (+)   Anesthesia Other Findings Exertional dyspnea, inhaler prn  Reproductive/Obstetrics negative OB ROS                             Anesthesia Physical Anesthesia Plan  ASA: III  Anesthesia Plan: MAC   Post-op Pain Management:    Induction:   PONV Risk Score and Plan:   Airway Management Planned:   Additional Equipment:   Intra-op Plan:   Post-operative Plan:   Informed Consent: I have reviewed the patients History and Physical, chart, labs and discussed the procedure including the risks, benefits and alternatives for the proposed anesthesia with the patient or authorized representative who has indicated his/her understanding and acceptance.   Dental Advisory Given  Plan Discussed with: CRNA and Anesthesiologist  Anesthesia Plan Comments:         Anesthesia Quick Evaluation

## 2018-04-20 NOTE — H&P (Signed)
The H and P was reviewed and updated. The patient was examined.  No changes were found after exam.  The surgical eye was marked.  

## 2018-04-20 NOTE — Op Note (Signed)
Date of procedure: 04/20/18  Pre-operative diagnosis: Visually significant cataract, Left Eye (H25.12)  Post-operative diagnosis: Visually significant cataract, Left Eye  Procedure: Removal of cataract via phacoemulsification and insertion of intra-ocular lens [place] [employer] [place]  +20.0D into the capsular bag of the Left Eye  Attending surgeon: Gerda Diss. Cyruss Arata, MD, MA  Anesthesia: MAC, Topical Akten  Complications: None  Estimated Blood Loss: <22m (minimal)  Specimens: None  Implants: As above  Indications:  Visually significant cataract, Left Eye  Procedure:  The patient was seen and identified in the pre-operative area. The operative eye was identified and dilated.  The operative eye was marked.  Topical anesthesia was administered to the operative eye.     The patient was then to the operative suite and placed in the supine position.  A timeout was performed confirming the patient, procedure to be performed, and all other relevant information.   The patient's face was prepped and draped in the usual fashion for intra-ocular surgery.  A lid speculum was placed into the operative eye and the surgical microscope moved into place and focused.  An inferotemporal paracentesis was created using a 20 gauge paracentesis blade.  Shugarcaine was injected into the anterior chamber.  Viscoelastic was injected into the anterior chamber.  A temporal clear-corneal main wound incision was created using a 2.479mmicrokeratome.  A continuous curvilinear capsulorrhexis was initiated using an irrigating cystitome and completed using capsulorrhexis forceps.  Hydrodissection and hydrodeliniation were performed.  Viscoelastic was injected into the anterior chamber.  A phacoemulsification handpiece and a chopper as a second instrument were used to remove the nucleus and epinucleus. The irrigation/aspiration handpiece was used to remove any remaining cortical material.   The capsular bag was  reinflated with viscoelastic, checked, and found to be intact.  The intraocular lens was inserted into the capsular bag and dialed into place using a Kuglen hook.  The irrigation/aspiration handpiece was used to remove any remaining viscoelastic.  The clear corneal wound and paracentesis wounds were then hydrated and checked with Weck-Cels to be watertight.  The lid-speculum and drape was removed, and the patient's face was cleaned with a wet and dry 4x4.  Maxitrol was instilled in the eye before a clear shield was taped over the eye. The patient was taken to the post-operative care unit in good condition, having tolerated the procedure well.  Post-Op Instructions: [facility] and will receive all other orders and instructions.

## 2018-04-20 NOTE — Discharge Instructions (Signed)
Please discharge patient when stable, will follow up today with Dr. Roniqua Kintz at the [facility] office immediately following discharge.  Leave shield in place until visit.  All paperwork with discharge instructions will be given at the office. ° ° °Monitored Anesthesia Care, Care After °These instructions provide you with information about caring for yourself after your procedure. Your health care provider may also give you more specific instructions. Your treatment has been planned according to current medical practices, but problems sometimes occur. Call your health care provider if you have any problems or questions after your procedure. °What can I expect after the procedure? °After your procedure, it is common to: °· Feel sleepy for several hours. °· Feel clumsy and have poor balance for several hours. °· Feel forgetful about what happened after the procedure. °· Have poor judgment for several hours. °· Feel nauseous or vomit. °· Have a sore throat if you had a breathing tube during the procedure. ° °Follow these instructions at home: °For at least 24 hours after the procedure: ° °· Do not: °? Participate in activities in which you could fall or become injured. °? Drive. °? Use heavy machinery. °? Drink alcohol. °? Take sleeping pills or medicines that cause drowsiness. °? Make important decisions or sign legal documents. °? Take care of children on your own. °· Rest. °Eating and drinking °· Follow the diet that is recommended by your health care provider. °· If you vomit, drink water, juice, or soup when you can drink without vomiting. °· Make sure you have little or no nausea before eating solid foods. °General instructions °· Have a responsible adult stay with you until you are awake and alert. °· Take over-the-counter and prescription medicines only as told by your health care provider. °· If you smoke, do not smoke without supervision. °· Keep all follow-up visits as told by your health care  provider. This is important. °Contact a health care provider if: °· You keep feeling nauseous or you keep vomiting. °· You feel light-headed. °· You develop a rash. °· You have a fever. °Get help right away if: °· You have trouble breathing. °This information is not intended to replace advice given to you by your health care provider. Make sure you discuss any questions you have with your health care provider. °Document Released: 12/27/2015 Document Revised: 04/27/2016 Document Reviewed: 12/27/2015 °Elsevier Interactive Patient Education © 2018 [employer] ° °

## 2018-04-20 NOTE — Anesthesia Postprocedure Evaluation (Signed)
Anesthesia Post Note  Patient: Victoria Adkins  Procedure(s) Performed: CATARACT EXTRACTION PHACO AND INTRAOCULAR LENS PLACEMENT (IOC) (Left Eye)  Patient location during evaluation: PACU Anesthesia Type: MAC Level of consciousness: awake and alert and oriented Pain management: pain level controlled Vital Signs Assessment: post-procedure vital signs reviewed and stable Respiratory status: spontaneous breathing, nonlabored ventilation, respiratory function stable and patient connected to nasal cannula oxygen Cardiovascular status: stable Postop Assessment: no apparent nausea or vomiting Anesthetic complications: no     Last Vitals:  Vitals:   04/20/18 0735 04/20/18 0741  BP: 123/76 123/76  Pulse:  63  Resp: (!) 26 (!) 23  Temp:  36.5 C  SpO2: 93% 95%    Last Pain:  Vitals:   04/20/18 0741  TempSrc: Oral  PainSc: 0-No pain                 Victoria Adkins

## 2018-04-23 ENCOUNTER — Encounter (HOSPITAL_COMMUNITY): Payer: Self-pay | Admitting: Ophthalmology

## 2018-05-01 ENCOUNTER — Encounter (HOSPITAL_COMMUNITY)
Admission: RE | Admit: 2018-05-01 | Discharge: 2018-05-01 | Disposition: A | Discharge status: Home / Self Care | Payer: Medicare HMO | Source: Ambulatory Visit | Attending: Ophthalmology | Admitting: Ophthalmology

## 2018-05-01 ENCOUNTER — Encounter (HOSPITAL_COMMUNITY): Payer: Self-pay

## 2018-05-04 ENCOUNTER — Encounter (HOSPITAL_COMMUNITY): Payer: Self-pay | Admitting: *Deleted

## 2018-05-04 ENCOUNTER — Encounter (HOSPITAL_COMMUNITY)
Admission: RE | Disposition: A | Discharge status: Home / Self Care | Payer: Self-pay | Source: Ambulatory Visit | Attending: Ophthalmology

## 2018-05-04 ENCOUNTER — Ambulatory Visit (HOSPITAL_COMMUNITY): Payer: Medicare HMO | Admitting: Anesthesiology

## 2018-05-04 ENCOUNTER — Ambulatory Visit (HOSPITAL_COMMUNITY)
Admission: RE | Admit: 2018-05-04 | Discharge: 2018-05-04 | Disposition: A | Discharge status: Home / Self Care | Payer: Medicare HMO | Source: Ambulatory Visit | Attending: Ophthalmology | Admitting: Ophthalmology

## 2018-05-04 ENCOUNTER — Other Ambulatory Visit: Payer: Self-pay

## 2018-05-04 DIAGNOSIS — F329 Major depressive disorder, single episode, unspecified: Secondary | ICD-10-CM | POA: Insufficient documentation

## 2018-05-04 DIAGNOSIS — Z79899 Other long term (current) drug therapy: Secondary | ICD-10-CM | POA: Diagnosis not present

## 2018-05-04 DIAGNOSIS — Z791 Long term (current) use of non-steroidal anti-inflammatories (NSAID): Secondary | ICD-10-CM | POA: Insufficient documentation

## 2018-05-04 DIAGNOSIS — H25811 Combined forms of age-related cataract, right eye: Secondary | ICD-10-CM | POA: Diagnosis present

## 2018-05-04 DIAGNOSIS — Z87891 Personal history of nicotine dependence: Secondary | ICD-10-CM | POA: Diagnosis not present

## 2018-05-04 DIAGNOSIS — F419 Anxiety disorder, unspecified: Secondary | ICD-10-CM | POA: Insufficient documentation

## 2018-05-04 HISTORY — PX: CATARACT EXTRACTION W/PHACO: SHX586

## 2018-05-04 SURGERY — CATARACT EXTRACTION PHACO AND INTRAOCULAR LENS PLACEMENT (IOC)
Anesthesia: Monitor Anesthesia Care | Site: Eye | Laterality: Right

## 2018-05-04 MED ORDER — LACTATED RINGERS IV SOLN
INTRAVENOUS | Status: DC
  Administered 2018-05-04: 07:00:00 via INTRAVENOUS

## 2018-05-04 MED ORDER — MIDAZOLAM HCL 2 MG/2ML IJ SOLN
INTRAMUSCULAR | Status: AC
  Filled 2018-05-04: qty 2

## 2018-05-04 MED ORDER — LIDOCAINE HCL 3.5 % OP GEL
1.0000 "application " | Freq: Once | OPHTHALMIC | Status: AC
  Administered 2018-05-04: 1 "application " via OPHTHALMIC

## 2018-05-04 MED ORDER — PROVISC 10 MG/ML IO SOLN
INTRAOCULAR | Status: DC | PRN
  Administered 2018-05-04: 0.85 mL via INTRAOCULAR

## 2018-05-04 MED ORDER — POVIDONE-IODINE 5 % OP SOLN
OPHTHALMIC | Status: DC | PRN
  Administered 2018-05-04: 1 "application " via OPHTHALMIC

## 2018-05-04 MED ORDER — PHENYLEPHRINE HCL 2.5 % OP SOLN
1.0000 [drp] | OPHTHALMIC | Status: AC
  Administered 2018-05-04 (×3): 1 [drp] via OPHTHALMIC

## 2018-05-04 MED ORDER — CYCLOPENTOLATE-PHENYLEPHRINE 0.2-1 % OP SOLN
1.0000 [drp] | OPHTHALMIC | Status: AC
  Administered 2018-05-04 (×3): 1 [drp] via OPHTHALMIC

## 2018-05-04 MED ORDER — MIDAZOLAM HCL 2 MG/2ML IJ SOLN
INTRAMUSCULAR | Status: DC | PRN
  Administered 2018-05-04: 2 mg via INTRAVENOUS

## 2018-05-04 MED ORDER — BSS IO SOLN
INTRAOCULAR | Status: DC | PRN
  Administered 2018-05-04: 15 mL via INTRAOCULAR

## 2018-05-04 MED ORDER — TETRACAINE HCL 0.5 % OP SOLN
1.0000 [drp] | OPHTHALMIC | Status: AC
  Administered 2018-05-04 (×3): 1 [drp] via OPHTHALMIC

## 2018-05-04 MED ORDER — LIDOCAINE HCL (PF) 1 % IJ SOLN
INTRAOCULAR | Status: DC | PRN
  Administered 2018-05-04: 1 mL via OPHTHALMIC

## 2018-05-04 MED ORDER — SODIUM HYALURONATE 23 MG/ML IO SOLN
INTRAOCULAR | Status: DC | PRN
  Administered 2018-05-04: 0.6 mL via INTRAOCULAR

## 2018-05-04 MED ORDER — EPINEPHRINE PF 1 MG/ML IJ SOLN
INTRAMUSCULAR | Status: DC | PRN
  Administered 2018-05-04: 500 mL

## 2018-05-04 MED ORDER — NEOMYCIN-POLYMYXIN-DEXAMETH 3.5-10000-0.1 OP SUSP
OPHTHALMIC | Status: DC | PRN
  Administered 2018-05-04: 2 [drp] via OPHTHALMIC

## 2018-05-04 SURGICAL SUPPLY — 13 items
CLOTH BEACON ORANGE TIMEOUT ST (SAFETY) ×2
EYE SHIELD UNIVERSAL CLEAR (GAUZE/BANDAGES/DRESSINGS) ×2
GLOVE BIOGEL PI IND STRL 7.0 (GLOVE) ×2
GLOVE BIOGEL PI INDICATOR 7.0 (GLOVE) ×2
LENS ALC ACRYL/TECN (Ophthalmic Related) ×2 IMPLANT
NDL HYPO 18GX1.5 BLUNT FILL (NEEDLE) IMPLANT
NEEDLE HYPO 18GX1.5 BLUNT FILL (NEEDLE) ×2
PAD ARMBOARD 7.5X6 YLW CONV (MISCELLANEOUS) ×2
SYR TB 1ML LL NO SAFETY (SYRINGE) ×2
TAPE SURG TRANSPORE 1 IN (GAUZE/BANDAGES/DRESSINGS) ×1
TAPE SURGICAL TRANSPORE 1 IN (GAUZE/BANDAGES/DRESSINGS) ×1
VISCOELASTIC ADDITIONAL (OPHTHALMIC RELATED) ×2
WATER STERILE IRR 250ML POUR (IV SOLUTION) ×2

## 2018-05-04 NOTE — Addendum Note (Signed)
Addendum  created 05/04/18 0901 by Franco NonesYates, Porcia Morganti S, CRNA   SmartForm saved

## 2018-05-04 NOTE — Op Note (Signed)
Date of procedure: 05/04/18  Pre-operative diagnosis: Visually significant cataract, Right Eye (H25.11)  Post-operative diagnosis: Visually significant cataract, Right Eye  Procedure: Removal of cataract via phacoemulsification and insertion of intra-ocular lens Wynetta Emer[employer] and [employer] PCB00  +21.5D into the capsular bag of the Right Eye  Attending surgeon: Gerda Diss. Tea Collums, MD, MA  Anesthesia: MAC, Topical Akten  Complications: None  Estimated Blood Loss: <58m (minimal)  Specimens: None  Implants: As above  Indications:  Visually significant cataract, Right Eye  Procedure:  The patient was seen and identified in the pre-operative area. The operative eye was identified and dilated.  The operative eye was marked.  Topical anesthesia was administered to the operative eye.     The patient was then to the operative suite and placed in the supine position.  A timeout was performed confirming the patient, procedure to be performed, and all other relevant information.   The patient's face was prepped and draped in the usual fashion for intra-ocular surgery.  A lid speculum was placed into the operative eye and the surgical microscope moved into place and focused.  A superotemporal paracentesis was created using a 20 gauge paracentesis blade.  Shugarcaine was injected into the anterior chamber.  Viscoelastic was injected into the anterior chamber.  A temporal clear-corneal main wound incision was created using a 2.443mmicrokeratome.  A continuous curvilinear capsulorrhexis was initiated using an irrigating cystitome and completed using capsulorrhexis forceps.  Hydrodissection and hydrodeliniation were performed.  Viscoelastic was injected into the anterior chamber.  A phacoemulsification handpiece and a chopper as a second instrument were used to remove the nucleus and epinucleus. The irrigation/aspiration handpiece was used to remove any remaining cortical material.   The capsular bag was  reinflated with viscoelastic, checked, and found to be intact.  The intraocular lens was inserted into the capsular bag and dialed into place using a Kuglen hook.  The irrigation/aspiration handpiece was used to remove any remaining viscoelastic.  The clear corneal wound and paracentesis wounds were then hydrated and checked with Weck-Cels to be watertight.  The lid-speculum and drape was removed, and the patient's face was cleaned with a wet and dry 4x4.  Maxitrol was instilled in the eye before a clear shield was taped over the eye. The patient was taken to the post-operative care unit in good condition, having tolerated the procedure well.  Post-Op Instructions: [facility] and will receive all other orders and instructions.

## 2018-05-04 NOTE — Anesthesia Procedure Notes (Signed)
Procedure Name: MAC Date/Time: 05/04/2018 7:25 AM Performed by: Vista Deck, CRNA Pre-anesthesia Checklist: Patient identified, Emergency Drugs available, Suction available, Timeout performed and Patient being monitored Patient Re-evaluated:Patient Re-evaluated prior to induction Oxygen Delivery Method: Nasal Cannula

## 2018-05-04 NOTE — Anesthesia Postprocedure Evaluation (Signed)
Anesthesia Post Note  Patient: [name]  Procedure(s) Performed: CATARACT EXTRACTION PHACO AND INTRAOCULAR LENS PLACEMENT (IOC) (Right Eye)  Patient location during evaluation: Short Stay Anesthesia Type: General Level of consciousness: awake and alert and patient cooperative Pain management: satisfactory to patient Vital Signs Assessment: post-procedure vital signs reviewed and stable Respiratory status: spontaneous breathing Cardiovascular status: stable Postop Assessment: no apparent nausea or vomiting Anesthetic complications: no     Last Vitals:  Vitals:   05/04/18 0640 05/04/18 0750  BP: 131/66 109/85  Pulse:  61  Resp: (!) 55 18  Temp:  36.5 C  SpO2: 94% 94%    Last Pain:  Vitals:   05/04/18 0750  TempSrc: Oral  PainSc: 0-No pain                 [name]

## 2018-05-04 NOTE — Anesthesia Preprocedure Evaluation (Addendum)
Anesthesia Evaluation  Patient identified by MRN, date of birth, ID band Patient awake    Reviewed: Allergy & Precautions, H&P , NPO status , Patient's Chart, lab work & pertinent test results, reviewed documented beta blocker date and time   Airway Mallampati: II  TM Distance: >3 FB Neck ROM: full    Dental no notable dental hx. (+) Dental Advidsory Given   Pulmonary neg pulmonary ROS, shortness of breath and with exertion, former smoker,    Pulmonary exam normal breath sounds clear to auscultation       Cardiovascular Exercise Tolerance: Good negative cardio ROS   Rhythm:regular Rate:Normal     Neuro/Psych PSYCHIATRIC DISORDERS Anxiety negative neurological ROS  negative psych ROS   GI/Hepatic negative GI ROS, Neg liver ROS,   Endo/Other  negative endocrine ROS  Renal/GU negative Renal ROS  negative genitourinary   Musculoskeletal   Abdominal   Peds  Hematology negative hematology ROS (+)   Anesthesia Other Findings Exertional dyspnea, inhaler prn  Reproductive/Obstetrics negative OB ROS                             Anesthesia Physical  Anesthesia Plan  ASA: II  Anesthesia Plan: MAC   Post-op Pain Management:    Induction:   PONV Risk Score and Plan:   Airway Management Planned:   Additional Equipment:   Intra-op Plan:   Post-operative Plan:   Informed Consent: I have reviewed the patients History and Physical, chart, labs and discussed the procedure including the risks, benefits and alternatives for the proposed anesthesia with the patient or authorized representative who has indicated his/her understanding and acceptance.   Dental Advisory Given  Plan Discussed with: CRNA  Anesthesia Plan Comments:        Anesthesia Quick Evaluation

## 2018-05-04 NOTE — Discharge Instructions (Signed)
Cataract °A cataract is cloudiness on the lens of your eye. The lens is the clear part of your eye that is behind your iris and pupil. The lens focuses light on the retina, which lets you see clearly. °When a lens becomes cloudy, vision may become blurry. The clouding can range from a tiny dot to complete cloudiness. As some cataracts develop, they make a person more nearsighted. Other cataracts increase glare. Cataracts can worsen over time, and sometimes the pupil can look white. Cataracts get bigger and they cloud more of the lens, making it difficult to see. Cataracts can affect one eye or both eyes. °What are the causes? °Most cataracts are associated with age-related eye changes. The eye lens is mostly made up of water and protein. Normally, this protein is arranged in a way that keeps the lens clear. Cataracts develop when protein begins to clump together over time. This clouds the lens and lets less light pass through to the retina, which causes blurry vision. °What increases the risk? °This condition is more likely to develop in people who: °· Are 60 years of age or older. °· Have diabetes. °· Have high blood pressure. °· Take certain medicines, such as steroids or hormone replacement therapy. °· Have had an eye injury. °· Have or have had eye inflammation. °· Have a family history of cataracts. °· Smoke. °· Drink alcohol heavily. °· Are frequently exposed to sun or very strong light without eye protection. °· Are obese. °· Have been exposed to large amounts of radiation, lead, or other toxic substances. °· Have had eye surgery. ° °What are the signs or symptoms? °The main symptom of a cataract is blurry vision. Your vision may change or get worse over time. Other symptoms include: °· Increased glare. °· Seeing a bright ring or halo around light. °· Poor night vision. °· Double vision in one eye. °· Having trouble seeing, even while wearing contact lenses or glasses. °· Seeing colors that appear  faded. °· Trouble telling the difference between blue and purple. °· Needing frequent changes to your prescription glasses or contacts. ° °How is this diagnosed? °This condition is diagnosed with a medical history and eye exam. You may need to see an eye specialist (optometrist or ophthalmologist). Your health care provider may enlarge (dilate) your pupils with eye drops to see the back of your eye more clearly and look for signs of cataracts or other damage. °You may also have tests, including: °· A visual acuity test. This uses a chart to determine the smallest letters that you can see from a specific distance. °· A slit-lamp exam. This uses a microscope to examine small sections of your eye for abnormalities. °· Tonometry. This test measures the pressure of the fluid inside your eye. ° °How is this treated? °Treatment depends on the stage of your cataract. For an early cataract, vision may improve by using different eyeglasses or stronger lighting. If that does not help your vision, surgery may be recommended to remove the cataract. °If your health care provider thinks your cataract may be linked to any medicines that you are taking, he or she may change your medicines. °Follow these instructions at home: °Lifestyle °· Use stronger or brighter lighting. °· Consider using a magnifying glass for reading or other activities. °· Become familiar with your surroundings. Having poor vision can put you at a greater risk for tripping, falling, or bumping into things. °· Wear sunglasses and a hat if you are sensitive to bright light   or are having problems with glare. °· Quit smoking if you smoke. If you need help quitting, talk with your health care provider. °General instructions °· If you are prescribed new eyeglasses, wear them as told by your health care provider. °· Take over-the-counter and prescription medicines only as told by your health care provider. Do not change your medicines unless told by your health care  provider. °· Do not drive or operate heavy machinery if your vision is blurry, particularly at night. °· Keep your blood sugar under control, if you have diabetes. °· Keep all follow-up visits as told by your health care provider. This is important. °Contact a health care provider if: °· Your symptoms get worse. °· Your vision affects your ability to perform daily activities. °· You have new symptoms. °· You have a fever. °Get help right away if: °· You have sudden vision loss. °· You have redness, swelling, or increasing pain in your eye. °· You develop a headache and sensitivity to light. °This information is not intended to replace advice given to you by your health care provider. Make sure you discuss any questions you have with your health care provider. °Document Released: 09/05/2005 Document Revised: 01/14/2016 Document Reviewed: 03/11/2015 °Elsevier Interactive Patient Education © 2018 [employer] ° ° °Monitored Anesthesia Care °Anesthesia is a term that refers to techniques, procedures, and medicines that help a person stay safe and comfortable during a medical procedure. Monitored anesthesia care, or sedation, is one type of anesthesia. Your anesthesia specialist may recommend sedation if you will be having a procedure that does not require you to be unconscious, such as: °· Cataract surgery. °· A dental procedure. °· A biopsy. °· A colonoscopy. ° °During the procedure, you may receive a medicine to help you relax (sedative). There are three levels of sedation: °· Mild sedation. At this level, you may feel awake and relaxed. You will be able to follow directions. °· Moderate sedation. At this level, you will be sleepy. You may not remember the procedure. °· Deep sedation. At this level, you will be asleep. You will not remember the procedure. ° °The more medicine you are given, the deeper your level of sedation will be. Depending on how you respond to the procedure, the anesthesia specialist may change  your level of sedation or the type of anesthesia to fit your needs. An anesthesia specialist will monitor you closely during the procedure. °Let your health care provider know about: °· Any allergies you have. °· All medicines you are taking, including vitamins, herbs, eye drops, creams, and over-the-counter medicines. °· Any use of steroids (by mouth or as a cream). °· Any problems you or family members have had with sedatives and anesthetic medicines. °· Any blood disorders you have. °· Any surgeries you have had. °· Any medical conditions you have, such as sleep apnea. °· Whether you are pregnant or may be pregnant. °· Any use of cigarettes, alcohol, or street drugs. °What are the risks? °Generally, this is a safe procedure. However, problems may occur, including: °· Getting too much medicine (oversedation). °· Nausea. °· Allergic reaction to medicines. °· Trouble breathing. If this happens, a breathing tube may be used to help with breathing. It will be removed when you are awake and breathing on your own. °· Heart trouble. °· Lung trouble. ° °Before the procedure °Staying hydrated °Follow instructions from your health care provider about hydration, which may include: °· Up to 2 hours before the procedure - you may continue   to drink clear liquids, such as water, clear fruit juice, black coffee, and plain tea. ° °Eating and drinking restrictions °Follow instructions from your health care provider about eating and drinking, which may include: °· 8 hours before the procedure - stop eating heavy meals or foods such as meat, fried foods, or fatty foods. °· 6 hours before the procedure - stop eating light meals or foods, such as toast or cereal. °· 6 hours before the procedure - stop drinking milk or drinks that contain milk. °· 2 hours before the procedure - stop drinking clear liquids. ° °Medicines °Ask your health care provider about: °· Changing or stopping your regular medicines. This is especially important if  you are taking diabetes medicines or blood thinners. °· Taking medicines such as aspirin and ibuprofen. These medicines can thin your blood. Do not take these medicines before your procedure if your health care provider instructs you not to. ° °Tests and exams °· You will have a physical exam. °· You may have blood tests done to show: °? How well your kidneys and liver are working. °? How well your blood can clot. ° °General instructions °· Plan to have someone take you home from the hospital or clinic. °· If you will be going home right after the procedure, plan to have someone with you for 24 hours. ° °What happens during the procedure? °· Your blood pressure, heart rate, breathing, level of pain and overall condition will be monitored. °· An IV tube will be inserted into one of your veins. °· Your anesthesia specialist will give you medicines as needed to keep you comfortable during the procedure. This may mean changing the level of sedation. °· The procedure will be performed. °After the procedure °· Your blood pressure, heart rate, breathing rate, and blood oxygen level will be monitored until the medicines you were given have worn off. °· Do not drive for 24 hours if you received a sedative. °· You may: °? Feel sleepy, clumsy, or nauseous. °? Feel forgetful about what happened after the procedure. °? Have a sore throat if you had a breathing tube during the procedure. °? Vomit. °This information is not intended to replace advice given to you by your health care provider. Make sure you discuss any questions you have with your health care provider. °Document Released: 06/01/2005 Document Revised: 02/12/2016 Document Reviewed: 12/27/2015 °Elsevier Interactive Patient Education © 2018 [employer] ° °

## 2018-05-04 NOTE — H&P (Signed)
The H and P was reviewed and updated. The patient was examined.  No changes were found after exam.  The surgical eye was marked.  

## 2018-05-04 NOTE — Transfer of Care (Signed)
Immediate Anesthesia Transfer of Care Note  Patient: Victoria Adkins  Procedure(s) Performed: CATARACT EXTRACTION PHACO AND INTRAOCULAR LENS PLACEMENT (IOC) (Right Eye)  Patient Location: Short Stay  Anesthesia Type:MAC  Level of Consciousness: awake, alert  and patient cooperative  Airway & Oxygen Therapy: Patient Spontanous Breathing  Post-op Assessment: Report given to RN and Post -op Vital signs reviewed and stable  Post vital signs: Reviewed and stable  Last Vitals:  Vitals Value Taken Time  BP    Temp    Pulse    Resp    SpO2      Last Pain:  Vitals:   05/04/18 0629  TempSrc: Oral  PainSc: 0-No pain      Patients Stated Pain Goal: 9 (80/32/12 2482)  Complications: No apparent anesthesia complications

## 2018-05-07 ENCOUNTER — Encounter (HOSPITAL_COMMUNITY): Payer: Self-pay | Admitting: Ophthalmology
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
## 2018-05-18 ENCOUNTER — Ambulatory Visit (HOSPITAL_COMMUNITY): Payer: Medicare HMO | Admitting: Psychiatry

## 2018-05-18 ENCOUNTER — Telehealth (HOSPITAL_COMMUNITY): Payer: Self-pay | Admitting: Psychiatry

## 2018-05-18 MED ORDER — SERTRALINE HCL 100 MG PO TABS: 200.0000 mg | ORAL_TABLET | Freq: Every day | ORAL | 0 refills | Status: DC

## 2018-05-18 NOTE — Telephone Encounter (Signed)
Received refill request for sertraline. Ordered for a month. Please contact the patient to make follow up.

## 2018-05-22 NOTE — Telephone Encounter (Signed)
Called patient and transferred her to front desk to rescheduled her appointment

## 2018-06-26 ENCOUNTER — Other Ambulatory Visit (INDEPENDENT_AMBULATORY_CARE_PROVIDER_SITE_OTHER): Payer: Medicare HMO

## 2018-06-26 ENCOUNTER — Ambulatory Visit (INDEPENDENT_AMBULATORY_CARE_PROVIDER_SITE_OTHER)
Admission: RE | Admit: 2018-06-26 | Discharge: 2018-06-26 | Disposition: A | Discharge status: Home / Self Care | Payer: Medicare HMO | Source: Ambulatory Visit | Attending: Internal Medicine | Admitting: Internal Medicine

## 2018-06-26 ENCOUNTER — Encounter: Payer: Self-pay | Admitting: Internal Medicine

## 2018-06-26 ENCOUNTER — Ambulatory Visit: Payer: Medicare HMO | Admitting: Internal Medicine

## 2018-06-26 VITALS — BP 124/80 | HR 72 | Ht 66.75 in | Wt 208.8 lb

## 2018-06-26 DIAGNOSIS — J449 Chronic obstructive pulmonary disease, unspecified: Secondary | ICD-10-CM | POA: Diagnosis not present

## 2018-06-26 DIAGNOSIS — R0609 Other forms of dyspnea: Secondary | ICD-10-CM

## 2018-06-26 DIAGNOSIS — R0600 Dyspnea, unspecified: Secondary | ICD-10-CM

## 2018-06-26 DIAGNOSIS — Z23 Encounter for immunization: Secondary | ICD-10-CM | POA: Diagnosis not present

## 2018-06-26 LAB — CBC WITH DIFFERENTIAL/PLATELET
Basophils Absolute: 0.1 10*3/uL (ref 0.0–0.1)
Basophils Relative: 1.1 % (ref 0.0–3.0)
Eosinophils Absolute: 0.1 10*3/uL (ref 0.0–0.7)
Eosinophils Relative: 1.5 % (ref 0.0–5.0)
HCT: 38.4 % (ref 36.0–46.0)
Hemoglobin: 13.2 g/dL (ref 12.0–15.0)
Lymphocytes Relative: 20.3 % (ref 12.0–46.0)
Lymphs Abs: 2 10*3/uL (ref 0.7–4.0)
MCHC: 34.5 g/dL (ref 30.0–36.0)
MCV: 87.2 fl (ref 78.0–100.0)
Monocytes Absolute: 0.6 10*3/uL (ref 0.1–1.0)
Monocytes Relative: 5.9 % (ref 3.0–12.0)
Neutro Abs: 6.9 10*3/uL (ref 1.4–7.7)
Neutrophils Relative %: 71.2 % (ref 43.0–77.0)
Platelets: 165 10*3/uL (ref 150.0–400.0)
RBC: 4.4 Mil/uL (ref 3.87–5.11)
RDW: 13.5 % (ref 11.5–15.5)
WBC: 9.7 10*3/uL (ref 4.0–10.5)

## 2018-06-26 LAB — BASIC METABOLIC PANEL WITH GFR
BUN: 20 mg/dL (ref 6–23)
CO2: 30 meq/L (ref 19–32)
Calcium: 9.7 mg/dL (ref 8.4–10.5)
Chloride: 99 meq/L (ref 96–112)
Creatinine, Ser: 0.89 mg/dL (ref 0.40–1.20)
GFR: 66.96 mL/min
Glucose, Bld: 89 mg/dL (ref 70–99)
Potassium: 4 meq/L (ref 3.5–5.1)
Sodium: 136 meq/L (ref 135–145)

## 2018-06-26 LAB — BRAIN NATRIURETIC PEPTIDE: Pro B Natriuretic peptide (BNP): 18 pg/mL (ref 0.0–100.0)

## 2018-06-26 LAB — TSH: TSH: 2.41 u[IU]/mL (ref 0.35–4.50)

## 2018-06-26 MED ORDER — UMECLIDINIUM-VILANTEROL 62.5-25 MCG/INH IN AEPB: 1.0000 | INHALATION_SPRAY | Freq: Every day | RESPIRATORY_TRACT | 0 refills | Status: DC

## 2018-06-26 MED ORDER — UMECLIDINIUM-VILANTEROL 62.5-25 MCG/INH IN AEPB: 1.0000 | INHALATION_SPRAY | Freq: Every day | RESPIRATORY_TRACT | 11 refills | Status: DC

## 2018-06-26 NOTE — Progress Notes (Signed)
Victoria Adkins, female    DOB: [birth year]  MRN: [id number]   Brief patient profile:  17 yowf quit smoking 2009 with cough issues then and  not really sob and 100% back to nl at wt 140 but gained wt x 2017 and breathing gradually worse since with   to point where doe x 50 ft to mailbox and stop / pushes buggy to get around at [employer] so self-referred to pulmonary clinic with GOLD III copd criteria 06/26/2018    06/26/2018  Pulmonary/ 1st pulmonary eval/ Victoria Adkins Chief Complaint  Patient presents with  . Pulmonary Consult    Self referral. Pt c/o DOE for the past several months. She gets winded with light housework and walking to the mailbox.   Dyspnea:  As above = MMRC3 = can't walk 100 yards even at a slow pace at a flat grade s stopping due to sob   Cough: none Sleep: flat bed / one pillow SABA use: did not help    No obvious day to day or daytime variability or assoc excess/ purulent sputum or mucus plugs or hemoptysis or cp or chest tightness, subjective wheeze or overt sinus or hb symptoms.   Sleeping as above  without nocturnal  or early am exacerbation  of respiratory  c/o's or need for noct saba. Also denies any obvious fluctuation of symptoms with weather or environmental changes or other aggravating or alleviating factors except as outlined above   No unusual exposure hx or h/o childhood pna/ asthma or knowledge of premature birth.  Current Allergies, Complete Past Medical History, Past Surgical History, Family History, and Social History were reviewed in [employer] record.  ROS  The following are not active complaints unless bolded Hoarseness, sore throat, dysphagia, dental problems, itching, sneezing,  nasal congestion or discharge of excess mucus or purulent secretions, ear ache,   fever, chills, sweats, unintended wt loss or wt gain, classically pleuritic or exertional cp,  orthopnea pnd or arm/hand swelling  or leg swelling, presyncope, palpitations,  abdominal pain, anorexia, nausea, vomiting, diarrhea  or change in bowel habits or change in bladder habits, change in stools or change in urine, dysuria, hematuria,  rash, arthralgias, visual complaints, headache, numbness, weakness or ataxia or problems with walking or coordination,  change in mood/ depression  or  memory.              Past Medical History:  Diagnosis Date  . Anxiety   . Arthritis 08/08/2016  . Dyspnea   . Hypercholesteremia     Outpatient Medications Prior to Visit  Medication Sig Dispense Refill  . albuterol (VENTOLIN HFA) 108 (90 Base) MCG/ACT inhaler Inhale 2 puffs into the lungs every 6 (six) hours as needed.    . meloxicam (MOBIC) 15 MG tablet Take 15 mg by mouth daily.    . sertraline (ZOLOFT) 100 MG tablet Take 2 tablets (200 mg total) by mouth daily. 60 tablet 0  . traZODone (DESYREL) 50 MG tablet Take 1 tablet (50 mg total) by mouth at bedtime. 30 tablet 0  . Vitamin D, Ergocalciferol, (DRISDOL) 50000 units CAPS capsule Take 50,000 Units by mouth every 7 (seven) days. On Saturday    . dicyclomine (BENTYL) 10 MG capsule TAKE ONE CAPSULE BY MOUTH 3 TIMES A DAY BEFORE MEALS (Patient not taking: Reported on 04/09/2018) 90 capsule 3  . simvastatin (ZOCOR) 20 MG tablet Take 20 mg by mouth at bedtime.  Objective:     BP 124/80 (BP Location: Left Arm, Cuff Size: Normal)   Pulse 72   Ht 5' 6.75" (1.695 m)   Wt 208 lb 12.8 oz (94.7 kg)   SpO2 98%   BMI 32.95 kg/m   SpO2: 98 %  RA  Mildly obese wf nad     HEENT: top denture/ o/w nl dentition / oropharynx. Nl external ear canals without cough reflex -  Mild bilateral non-specific turbinate edema     NECK :  without JVD/Nodes/TM/ nl carotid upstrokes bilaterally   LUNGS: no acc muscle use,  Mild barrel  contour chest wall with bilateral  Distant bs s audible wheeze and  without cough on insp or exp maneuver and mild  Hyperresonant  to  percussion bilaterally     CV:  RRR  no s3 or  murmur or increase in P2, and no edema   ABD:  Obese/ soft and nontender with pos mid insp Hoover's  in the supine position. No bruits or organomegaly appreciated, bowel sounds nl  MS:   Nl gait/  ext warm without deformities, calf tenderness, cyanosis or clubbing No obvious joint restrictions   SKIN: warm and dry without lesions    NEURO:  alert, approp, nl sensorium with  no motor or cerebellar deficits apparent.      Labs ordered 06/26/2018  Alpha one AT screen   Labs ordered/ reviewed:      Chemistry      Component Value Date/Time   NA 136 06/26/2018 1439   K 4.0 06/26/2018 1439   CL 99 06/26/2018 1439   CO2 30 06/26/2018 1439   BUN 20 06/26/2018 1439   CREATININE 0.89 06/26/2018 1439      Component Value Date/Time   CALCIUM 9.7 06/26/2018                               Lab Results  Component Value Date   WBC 9.7 06/26/2018   HGB 13.2 06/26/2018   HCT 38.4 06/26/2018   MCV 87.2 06/26/2018   PLT 165.0 06/26/2018       EOS                                                               0.1                                    06/26/2018   Lab Results  Component Value Date   DDIMER 0.96 (H) 06/26/2018      Lab Results  Component Value Date   TSH 2.41 06/26/2018     Lab Results  Component Value Date   PROBNP 18.0 06/26/2018      CXR PA and Lateral:   06/26/2018 :    I personally reviewed images and impression as follows:  Mild copd changes only           Assessment   DOE (dyspnea on exertion) 06/26/2018  Walked RA x 3 laps @ 185 ft each stopped due to  End of study, slow pace, mild sob sats 89% at end    DDX of  difficult airways management almost  all start with A and  include Adherence, Ace Inhibitors, Acid Reflux, Active Sinus Disease, Alpha 1 Antitripsin deficiency, Anxiety masquerading as Airways dz,  ABPA,  Allergy(esp in young), Aspiration (esp in elderly), Adverse effects of meds,  Active smokers, A bunch of PE's (a small clot burden can't cause  this syndrome unless there is already severe underlying pulm or vascular dz with poor reserve) plus two Bs  = Bronchiectasis and Beta blocker use..and one C= CHF   Adherence is always the initial "prime suspect" and is a multilayered concern that requires a "trust but verify" approach in every patient - starting with knowing how to use medications, especially inhalers, correctly, keeping up with refills and understanding the fundamental difference between maintenance and prns vs those medications only taken for a very short course and then stopped and not refilled.  - see device teaching  - return with all meds in hand using a trust but verify approach to confirm accurate Medication  Reconciliation The principal here is that until we are certain that the  patients are doing what we've asked, it makes no sense to ask them to do more.    ? Allergy /asthma > strongly doubt, ok to just use lama/laba here (see copd)  ? Alpha one AT def > send screen   ? A bunch of pe's > D dimer nl - while a normal  or high normal value (seen commonly in the elderly or chronically ill)  may miss small peripheral pe, the clot burden with sob is moderately high and so even a high nl  d dimer  has a very high neg pred value if used in this setting (chronicity does not support pe's)  ? Anxiety/depression/ deconditioning with wt gain  > usually at the bottom of this list of usual suspects but should be  on this pt's based on H and P and note already on psychotropics and may interfere with adherence and also interpretation of response or lack thereof to symptom management which can be quite subjective.     ? chf > excluded by bnp today          COPD  GOLD III  Quit smoking 2009  Spirometry 06/26/2018  FEV1 0.8 (32%)  Ratio 56  - 06/26/2018  After extensive coaching inhaler device,  effectiveness =    90% with elipta > try anoro   - alpha one AT screen 06/26/2018   >>> apparently Pt is GOLD III/ Group B in terms of  symptom/risk and laba/lama therefore appropriate rx at this point pending return for full pfts    Total time devoted to counseling  > 50 % of initial 60 min office visit:  review case with pt/ discussion of options/alternatives/ personally creating written customized instructions  in presence of pt  then going over those specific  Instructions directly with the pt including how to use all of the meds but in particular covering each new medication in detail and the difference between the maintenance= "automatic" meds and the prns using an action plan format for the latter (If this problem/symptom => do that organization reading Left to right).  Please see AVS from this visit for a full list of these instructions which I personally wrote for this pt and  are unique to this visit.   See device teaching which extended face to face time for this visit      Sandrea Hughs, MD 06/26/2018

## 2018-06-26 NOTE — Patient Instructions (Addendum)
Anoro one click each am - fill the rx if you feel it's helping but keep walking as much as possible    Please remember to go to the lab and x-ray department downstairs in the basement  for your tests - we will call you with the results when they are available.     Please schedule a follow up office visit in 4 weeks, sooner if needed with full pfts

## 2018-06-27 ENCOUNTER — Encounter: Payer: Self-pay | Admitting: Internal Medicine

## 2018-06-27 ENCOUNTER — Other Ambulatory Visit: Payer: Self-pay | Admitting: Internal Medicine

## 2018-06-27 DIAGNOSIS — J449 Chronic obstructive pulmonary disease, unspecified: Secondary | ICD-10-CM | POA: Insufficient documentation

## 2018-06-27 DIAGNOSIS — R0609 Other forms of dyspnea: Principal | ICD-10-CM

## 2018-06-27 DIAGNOSIS — R0600 Dyspnea, unspecified: Secondary | ICD-10-CM

## 2018-06-27 NOTE — Assessment & Plan Note (Signed)
06/26/2018  Walked RA x 3 laps @ 185 ft each stopped due to  End of study, slow pace, mild sob sats 89% at end    DDX of  difficult airways management almost all start with A and  include Adherence, Ace Inhibitors, Acid Reflux, Active Sinus Disease, Alpha 1 Antitripsin deficiency, Anxiety masquerading as Airways dz,  ABPA,  Allergy(esp in young), Aspiration (esp in elderly), Adverse effects of meds,  Active smokers, A bunch of PE's (a small clot burden can't cause this syndrome unless there is already severe underlying pulm or vascular dz with poor reserve) plus two Bs  = Bronchiectasis and Beta blocker use..and one C= CHF   Adherence is always the initial "prime suspect" and is a multilayered concern that requires a "trust but verify" approach in every patient - starting with knowing how to use medications, especially inhalers, correctly, keeping up with refills and understanding the fundamental difference between maintenance and prns vs those medications only taken for a very short course and then stopped and not refilled.  - see device teaching  - return with all meds in hand using a trust but verify approach to confirm accurate Medication  Reconciliation The principal here is that until we are certain that the  patients are doing what we've asked, it makes no sense to ask them to do more.    ? Allergy /asthma > strongly doubt, ok to just use lama/laba here (see copd)  ? Alpha one AT def > send screen   ? A bunch of pe's > D dimer nl - while a normal  or high normal value (seen commonly in the elderly or chronically ill)  may miss small peripheral pe, the clot burden with sob is moderately high and so even a high nl  d dimer  has a very high neg pred value if used in this setting (chronicity does not support pe's)  ? Anxiety/depression/ deconditioning with wt gain  > usually at the bottom of this list of usual suspects but should be  on this pt's based on H and P and note already on psychotropics  and may interfere with adherence and also interpretation of response or lack thereof to symptom management which can be quite subjective.     ? chf > excluded by bnp today

## 2018-06-27 NOTE — Progress Notes (Signed)
Spoke with pt and notified of results per Dr. Wert. Pt verbalized understanding and denied any questions. 

## 2018-06-27 NOTE — Assessment & Plan Note (Addendum)
Quit smoking 2009  Spirometry 06/26/2018  FEV1 0.8 (32%)  Ratio 56  - 06/26/2018  After extensive coaching inhaler device,  effectiveness =    90% with elipta > try anoro   - alpha one AT screen 06/26/2018   >>> apparently Pt is GOLD III/ Group B in terms of symptom/risk and laba/lama therefore appropriate rx at this point pending return for full pfts    Total time devoted to counseling  > 50 % of initial 60 min office visit:  review case with pt/ discussion of options/alternatives/ personally creating written customized instructions  in presence of pt  then going over those specific  Instructions directly with the pt including how to use all of the meds but in particular covering each new medication in detail and the difference between the maintenance= "automatic" meds and the prns using an action plan format for the latter (If this problem/symptom => do that organization reading Left to right).  Please see AVS from this visit for a full list of these instructions which I personally wrote for this pt and  are unique to this visit.   See device teaching which extended face to face time for this visit

## 2018-06-28 ENCOUNTER — Telehealth: Payer: Self-pay | Admitting: *Deleted

## 2018-06-28 ENCOUNTER — Ambulatory Visit (HOSPITAL_COMMUNITY)
Admission: RE | Admit: 2018-06-28 | Discharge: 2018-06-28 | Disposition: A | Discharge status: Home / Self Care | Payer: Medicare HMO | Source: Ambulatory Visit | Attending: Internal Medicine | Admitting: Internal Medicine

## 2018-06-28 DIAGNOSIS — R0600 Dyspnea, unspecified: Secondary | ICD-10-CM

## 2018-06-28 DIAGNOSIS — R0609 Other forms of dyspnea: Secondary | ICD-10-CM | POA: Diagnosis present

## 2018-06-28 NOTE — Telephone Encounter (Signed)
Nothing further to do for now

## 2018-06-28 NOTE — Telephone Encounter (Signed)
Received call report from [facility] with [facility] with preliminary results on patient's Venous doppler done on 06/28/2018.  MW please review the result/impression copied below:  "Patient negative for blood clot bilateral"  Please advise, thank you.

## 2018-06-29 NOTE — Progress Notes (Signed)
Spoke with pt and notified of results per Dr. Wert. Pt verbalized understanding and denied any questions. 

## 2018-06-30 LAB — D-DIMER, QUANTITATIVE: D-Dimer, Quant: 0.96 ug{FEU}/mL — ABNORMAL HIGH

## 2018-06-30 LAB — ALPHA-1 ANTITRYPSIN PHENOTYPE: A-1 Antitrypsin, Ser: 150 mg/dL (ref 83–199)

## 2018-07-24 ENCOUNTER — Encounter: Payer: Self-pay | Admitting: Internal Medicine

## 2018-07-24 ENCOUNTER — Ambulatory Visit (INDEPENDENT_AMBULATORY_CARE_PROVIDER_SITE_OTHER): Payer: Medicare HMO | Admitting: Internal Medicine

## 2018-07-24 DIAGNOSIS — J449 Chronic obstructive pulmonary disease, unspecified: Secondary | ICD-10-CM | POA: Diagnosis not present

## 2018-07-24 DIAGNOSIS — R0609 Other forms of dyspnea: Secondary | ICD-10-CM | POA: Diagnosis not present

## 2018-07-24 DIAGNOSIS — R0600 Dyspnea, unspecified: Secondary | ICD-10-CM

## 2018-07-24 MED ORDER — GLYCOPYRROLATE-FORMOTEROL 9-4.8 MCG/ACT IN AERO: 2.0000 | INHALATION_SPRAY | Freq: Two times a day (BID) | RESPIRATORY_TRACT | 0 refills | Status: DC

## 2018-07-24 MED ORDER — GLYCOPYRROLATE-FORMOTEROL 9-4.8 MCG/ACT IN AERO: 2.0000 | INHALATION_SPRAY | Freq: Two times a day (BID) | RESPIRATORY_TRACT | 11 refills | Status: DC

## 2018-07-24 NOTE — Progress Notes (Signed)
Victoria Adkins, female    DOB: March 19, 1951  MRN: [id number]   Brief patient profile:  64 yowf quit smoking 2009 with cough issues then and  not really sob and 100% back to nl at wt 140 but gained wt x 2017 and breathing gradually worse since with   to point where doe x 50 ft to mailbox and stop / pushes buggy to get around at [employer] so self-referred to pulmonary clinic with GOLD III copd criteria 06/26/2018    06/26/2018  Pulmonary/ 1st pulmonary eval/ Donnamae [facility] Chief Complaint  Patient presents with  . Pulmonary Consult    Self referral. Pt c/o DOE for the past several months. She gets winded with light housework and walking to the mailbox.   Dyspnea:  As above = MMRC3 = can't walk 100 yards even at a slow pace at a flat grade s stopping due to sob   Cough: none Sleep: flat bed / one pillow SABA use: did not help  rec Anoro one click each am - fill the rx if you feel it's helping but keep walking as much as possible    07/24/2018  f/u ov/Jakayla Schweppe re:  Copd GOLD III  Improved but concerned about cost of anoro/access under her insurance  Chief Complaint  Patient presents with  . Follow-up    Breathing has improved some. She has not needed her albuterol inhaler.   Dyspnea:  MMRC2 = can't walk a nl pace on a flat grade s sob but does fine slow and flat  Cough: none Sleeping: flat bed/ one pillow SABA use: none  02: none       No obvious day to day or daytime variability or assoc excess/ purulent sputum or mucus plugs or hemoptysis or cp or chest tightness, subjective wheeze or overt sinus or hb symptoms.   Sleeps as above without nocturnal  or early am exacerbation  of respiratory  c/o's or need for noct saba. Also denies any obvious fluctuation of symptoms with weather or environmental changes or other aggravating or alleviating factors except as outlined above   No unusual exposure hx or h/o childhood pna/ asthma or knowledge of premature birth.  Current Allergies, Complete Past Medical  History, Past Surgical History, Family History, and Social History were reviewed in [employer] record.  ROS  The following are not active complaints unless bolded Hoarseness, sore throat, dysphagia, dental problems, itching, sneezing,  nasal congestion or discharge of excess mucus or purulent secretions, ear ache,   fever, chills, sweats, unintended wt loss or wt gain, classically pleuritic or exertional cp,  orthopnea pnd or arm/hand swelling  or leg swelling, presyncope, palpitations, abdominal pain, anorexia, nausea, vomiting, diarrhea  or change in bowel habits or change in bladder habits, change in stools or change in urine, dysuria, hematuria,  rash, arthralgias, visual complaints, headache, numbness, weakness or ataxia or problems with walking or coordination,  change in mood or  memory.        Current Meds  Medication Sig  . albuterol (VENTOLIN HFA) 108 (90 Base) MCG/ACT inhaler Inhale 2 puffs into the lungs every 6 (six) hours as needed.  . meloxicam (MOBIC) 15 MG tablet Take 15 mg by mouth daily.  . sertraline (ZOLOFT) 100 MG tablet Take 2 tablets (200 mg total) by mouth daily.  . traZODone (DESYREL) 50 MG tablet Take 1 tablet (50 mg total) by mouth at bedtime.  Marland Kitchen umeclidinium-vilanterol (ANORO ELLIPTA) 62.5-25 MCG/INH AEPB Inhale 1 puff into the lungs  daily.  . Vitamin D, Ergocalciferol, (DRISDOL) 50000 units CAPS capsule Take 50,000 Units by mouth every 7 (seven) days. On Saturday                       Objective:     amb mildly obese wf nad   Wt Readings from Last 3 Encounters:  07/24/18 210 lb (95.3 kg)  06/26/18 208 lb 12.8 oz (94.7 kg)  05/04/18 203 lb (92.1 kg)     Vital signs reviewed - Note on arrival 02 sats  96% on RA        HEENT: Top denture/ nl  oropharynx. Nl external ear canals without cough reflex -  Mild bilateral non-specific turbinate edema     NECK :  without JVD/Nodes/TM/ nl carotid upstrokes bilaterally   LUNGS:  no acc muscle use,  Mod barrel  contour chest wall with bilateral  Distant bs s audible wheeze and  without cough on insp or exp maneuver and mod  Hyperresonant  to  percussion bilaterally     CV:  RRR  no s3 or murmur or increase in P2, and no edema   ABD:  soft and nontender with pos late  insp Hoover's  in the supine position. No bruits or organomegaly appreciated, bowel sounds nl  MS:   Nl gait/  ext warm without deformities, calf tenderness, cyanosis or clubbing No obvious joint restrictions   SKIN: warm and dry without lesions    NEURO:  alert, approp, nl sensorium with  no motor or cerebellar deficits apparent.                Assessment

## 2018-07-24 NOTE — Patient Instructions (Addendum)
Plan A = Automatic =  Anoro one click each am  OR  Bevespi Take 2 puffs first thing in am and then another 2 puffs about 12 hours later.   Work on inhaler technique:  relax and gently blow all the way out then take a nice smooth deep breath back in, triggering the inhaler at same time you start breathing in.  Hold for up to 5 seconds if you can.   Rinse and gargle with water when done   Plan B = Backup Only use your albuterol as a rescue medication to be used if you can't catch your breath by resting or doing a relaxed purse lip breathing pattern.  - The less you use it, the better it will work when you need it. - Ok to use the inhaler up to 1-2 puffs  every 4 hours if you must but call for appointment if use goes up over your usual need - Don't leave home without it !!  (think of it like the spare tire for your car)    To get the most out of exercise, you need to be continuously aware that you are short of breath, but never out of breath, for 30 minutes daily. As you improve, it will actually be easier for you to do the same amount of exercise  in  30 minutes so always push to the level where you are short of breath    Please schedule a follow up visit in 3 months but call sooner if needed  with all medications /inhalers/ solutions in hand so we can verify exactly what you are taking. This includes all medications from all doctors and over the counters  -  PFT's on return

## 2018-07-25 ENCOUNTER — Encounter: Payer: Self-pay | Admitting: Internal Medicine

## 2018-07-25 NOTE — Assessment & Plan Note (Signed)
06/26/2018  Walked RA x 3 laps @ 185 ft each stopped due to  End of study, slow pace, mild sob sats 89% at end   - D dimer elevated 06/26/18 > venous dopplers neg bilaterally 06/28/2018   Main issue at this point is conditioning > reviewed reconditioning see avs for instructions unique to this ov      I had an extended discussion with the patient reviewing all relevant studies completed to date and  lasting 15 to 20 minutes of a 25 minute visit    See device teaching which extended face to face time for this visit.  Each maintenance medication was reviewed in detail including emphasizing most importantly the difference between maintenance and prns and under what circumstances the prns are to be triggered using an action plan format that is not reflected in the computer generated alphabetically organized AVS which I have not found useful in most complex patients, especially with respiratory illnesses  Please see AVS for specific instructions unique to this visit that I personally wrote and verbalized to the the pt in detail and then reviewed with pt  by my nurse highlighting any  changes in therapy recommended at today's visit to their plan of care.

## 2018-07-25 NOTE — Assessment & Plan Note (Addendum)
Quit smoking 2009  Spirometry 06/26/2018  FEV1 0.8 (32%)  Ratio 56  - 06/26/2018  After extensive coaching inhaler device,  effectiveness =    90% with elipta > try anoro   - alpha one AT screen 06/26/2018   MM level 150   - 07/24/2018  After extensive coaching inhaler device,  effectiveness =   75% with hfa and 90% with dpi    Pt is Group B in terms of symptom/risk and laba/lama therefore appropriate rx at this point.  The main issue is insurance/access.  Formulary restrictions will be an ongoing challenge for the forseable future and I would be happy to pick an alternative if the pt will first  provide me a list of them but pt  will need to return here for training for any new device that is required eg dpi vs hfa vs respimat.    In meantime we can always provide samples so the patient never runs out of any needed respiratory medications.    Also rec rehab but wants to try on her own    F/u in 3 m with full pfts and with all meds in hand using a trust but verify approach to confirm accurate Medication  Reconciliation The principal here is that until we are certain that the  patients are doing what we've asked, it makes no sense to ask them to do more.

## 2018-10-17 ENCOUNTER — Other Ambulatory Visit: Payer: Self-pay | Admitting: Family Medicine

## 2018-10-17 DIAGNOSIS — Z1231 Encounter for screening mammogram for malignant neoplasm of breast: Secondary | ICD-10-CM

## 2018-10-24 ENCOUNTER — Ambulatory Visit: Payer: Self-pay | Admitting: Internal Medicine

## 2018-12-03 ENCOUNTER — Other Ambulatory Visit: Payer: Self-pay

## 2018-12-03 ENCOUNTER — Ambulatory Visit
Admission: RE | Admit: 2018-12-03 | Discharge: 2018-12-03 | Disposition: A | Discharge status: Home / Self Care | Payer: Medicare HMO | Source: Ambulatory Visit | Attending: Family Medicine | Admitting: Family Medicine

## 2018-12-03 DIAGNOSIS — Z1231 Encounter for screening mammogram for malignant neoplasm of breast: Secondary | ICD-10-CM

## 2019-08-23 ENCOUNTER — Other Ambulatory Visit: Payer: Self-pay

## 2019-08-23 DIAGNOSIS — Z20822 Contact with and (suspected) exposure to covid-19: Secondary | ICD-10-CM

## 2019-08-25 LAB — NOVEL CORONAVIRUS, NAA: SARS-CoV-2, NAA: NOT DETECTED

## 2019-08-27 ENCOUNTER — Encounter: Payer: Self-pay | Admitting: *Deleted

## 2019-08-28 ENCOUNTER — Encounter: Payer: Self-pay | Admitting: Cardiology

## 2019-08-28 ENCOUNTER — Other Ambulatory Visit: Payer: Self-pay

## 2019-08-28 ENCOUNTER — Encounter

## 2019-08-28 ENCOUNTER — Ambulatory Visit: Payer: Medicare HMO | Admitting: Cardiology

## 2019-08-28 ENCOUNTER — Encounter: Payer: Self-pay | Admitting: *Deleted

## 2019-08-28 VITALS — BP 154/80 | HR 68 | Ht 67.0 in | Wt 210.2 lb

## 2019-08-28 DIAGNOSIS — E782 Mixed hyperlipidemia: Secondary | ICD-10-CM

## 2019-08-28 DIAGNOSIS — F17201 Nicotine dependence, unspecified, in remission: Secondary | ICD-10-CM

## 2019-08-28 DIAGNOSIS — R0609 Other forms of dyspnea: Secondary | ICD-10-CM

## 2019-08-28 DIAGNOSIS — J449 Chronic obstructive pulmonary disease, unspecified: Secondary | ICD-10-CM | POA: Diagnosis not present

## 2019-08-28 DIAGNOSIS — R0600 Dyspnea, unspecified: Secondary | ICD-10-CM | POA: Diagnosis not present

## 2019-08-28 NOTE — Addendum Note (Signed)
Addended by: Julian Hy T on: 08/28/2019 02:43 PM   Modules accepted: Orders

## 2019-08-28 NOTE — Patient Instructions (Signed)
Your physician recommends that you schedule a follow-up appointment in: Victoria Adkins has recommended you make the following change in your medication:   START ASPIRIN 81 MG DAILY   RE START SIMVASTATIN   Thank you for choosing [facility]!!

## 2019-08-28 NOTE — Progress Notes (Signed)
Cardiology Office Note  Date: 08/28/2019   ID: [name], DOB 1951/04/01, MRN [id number]  PCP:  Salley SlaughterBoone, Angela, NP  Consulting Cardiologist:  Victoria DellSamuel Maddox Hlavaty, MD Electrophysiologist:  None   Chief Complaint  Patient presents with  . Shortness of Breath  . Chest Pain    History of Present Illness: Victoria Adkins is a 68 y.o. female referred for cardiology consultation by Ms. Lissa HoardBoone DNP for the evaluation of chest discomfort and shortness of breath.  She is here today with her daughter.  She tells me that she is chronically short of breath with activity such as walking to her mailbox, about 50 feet away from her door, NYHA class III.  This has been going on for the last few years.  She also reports intermittent episodes of chest tightness, these seem to be most notable with emotional upset rather than activity necessarily.  They improve when she relaxes.  She was seen by Dr. Sherene SiresWert back in October 2019 for dyspnea on exertion.  She has a prior history of tobacco abuse and was described as having GOLD III COPD.  She is currently not on any standing MDIs, does report intermittent wheezing.  Echocardiogram done recently at [facility] reported mild LVH with LVEF 60 to 65%, no wall motion abnormalities, diastolic dysfunction, normal RV contraction, normal biatrial chamber size, mildly sclerotic aortic valve without stenosis, mild tricuspid regurgitation with RVSP estimated 20 to 30 mmHg, no pericardial effusion.  I discussed the results with her today.    She also underwent a Scientist, physiologicalLexiscan Myoview at [facility] on November 17.  There were no diagnostic ST segment changes.  Perfusion imaging was normal, no reversible defects to suggest ischemia and LVEF 55%, low risk findings.  We discussed this today.  10-year risk of CVD is approximately 13%, intermediate range.  She was previously on simvastatin but wanted to simplify her medications and came off recently.  She is not on aspirin at  this time.  Past Medical History:  Diagnosis Date  . Anxiety   . Arthritis   . Depression   . Hyperlipidemia     Past Surgical History:  Procedure Laterality Date  . CATARACT EXTRACTION W/PHACO Left 04/20/2018   Procedure: CATARACT EXTRACTION PHACO AND INTRAOCULAR LENS PLACEMENT (IOC);  Surgeon: Victoria PierceWrzosek, James, MD;  Location: [facility];  Service: Ophthalmology;  Laterality: Left;  CDE: 4.71  . CATARACT EXTRACTION W/PHACO Right 05/04/2018   Procedure: CATARACT EXTRACTION PHACO AND INTRAOCULAR LENS PLACEMENT (IOC);  Surgeon: Victoria PierceWrzosek, James, MD;  Location: [facility];  Service: Ophthalmology;  Laterality: Right;  CDE: 5.56  . CORRECTION HAMMER TOE Bilateral   . REPLACEMENT TOTAL KNEE Right 2011  . VAGINAL HYSTERECTOMY      Current Outpatient Medications  Medication Sig Dispense Refill  . aspirin EC 81 MG tablet Take 81 mg by mouth daily.    . meloxicam (MOBIC) 15 MG tablet Take 15 mg by mouth daily.    Marland Kitchen. MISC NATURAL PRODUCTS PO Take 1 tablet by mouth daily. Neuriva for memory    . sertraline (ZOLOFT) 100 MG tablet Take 2 tablets (200 mg total) by mouth daily. 60 tablet 0  . SIMVASTATIN PO Take by mouth.    . temazepam (RESTORIL) 30 MG capsule Take 30 mg by mouth at bedtime.     No current facility-administered medications for this visit.    Allergies:  Patient has no known allergies.   Social History: The patient  reports that she quit smoking about 11  years ago. Her smoking use included cigarettes. She has a 20.00 pack-year smoking history. She has never used smokeless tobacco. She reports current alcohol use. She reports that she does not use drugs.   Family History: The patient's family history includes Alcohol abuse in her maternal grandfather; Blindness in her sister; Brain cancer in her mother; Dementia in her father; Emphysema in her father; Seizures in her sister.   ROS:  Please see the history of present illness. Otherwise, complete review of systems is positive for anxiety.  All  other systems are reviewed and negative.   Physical Exam: VS:  BP (!) 154/80   Pulse 68   Ht 5\' 7"  (1.702 m)   Wt 210 lb 3.2 oz (95.3 kg)   SpO2 95%   BMI 32.92 kg/m , BMI Body mass index is 32.92 kg/m.  Wt Readings from Last 3 Encounters:  08/28/19 210 lb 3.2 oz (95.3 kg)  07/24/18 210 lb (95.3 kg)  06/26/18 208 lb 12.8 oz (94.7 kg)    General: Patient appears comfortable at rest. HEENT: Conjunctiva and lids normal, wearing a mask. Neck: Supple, no elevated JVP or carotid bruits, no thyromegaly. Lungs: No wheezing or rhonchi, nonlabored breathing at rest. Cardiac: Regular rate and rhythm, no S3, 1-2/6 basal systolic murmur, no pericardial rub. Abdomen: Soft, nontender, bowel sounds present. Extremities: No pitting edema, distal pulses 2+. Skin: Warm and dry. Musculoskeletal: No kyphosis. Neuropsychiatric: Alert and oriented x3, affect grossly appropriate.  ECG:  An ECG dated November 2020 was personally reviewed today and demonstrated:  Normal sinus rhythm.  Recent Labwork:  June 2019: Hemoglobin 12.8, platelets 131, BUN 21, creatinine 0.8, potassium 4.9, AST 16, ALT 11, cholesterol 241, triglycerides 159, HDL 55, LDL 154 TSH 2.62  Other Studies Reviewed Today:  Chest x-ray 06/26/2018: FINDINGS: The heart size and mediastinal contours are within normal limits. Both lungs are clear. The visualized skeletal structures are unremarkable.  IMPRESSION: No active cardiopulmonary disease.  Stable appearance from priors.  Assessment and Plan:  1.  Chronic shortness of breath and intermittent chest tightness.  Dyspnea is exertional but her chest tightness seems to be more associated with emotional upset.  She has intermediate cardiovascular risk based on 10-year calculations, history of previous tobacco abuse but not for at least 10 years, currently untreated hyperlipidemia but previously on statin therapy.  Baseline ECG is normal.  Recent echocardiogram shows normal LVEF with  mild diastolic dysfunction and mildly sclerotic aortic valve without stenosis.  RVSP estimated in normal range.  Lexiscan Myoview was also low risk without obvious ischemic territories to suggest obstructive CAD.  We have reviewed these issues today.  At this point recommending that she start on aspirin 81 mg daily and resume prior statin therapy.  She could have microvascular dysfunction contributing to symptoms.  We will arrange office follow-up and can discuss whether we ultimately need to consider invasive cardiac testing depending on how she does.  Would also recommend follow-up PFTs.  2.  Previous history of tobacco abuse.  She had GOLD III COPD per evaluation by Dr. 08/26/2018 last year.  3.  Elevated blood pressure today without baseline history of hypertension.  Keep follow-up with PCP.  Medication Adjustments/Labs and Tests Ordered: Current medicines are reviewed at length with the patient today.  Concerns regarding medicines are outlined above.   Tests Ordered: No orders of the defined types were placed in this encounter.   Medication Changes: No orders of the defined types were placed in this encounter.  Disposition:  Follow up 3 months in the [place] office.  Signed, Satira Sark, MD, [facility] 08/28/2019 2:40 PM    [place] [facility] [place], [place], [place] [address] Phone: [phone]; Fax: [phone]

## 2019-12-05 ENCOUNTER — Ambulatory Visit: Payer: Medicare HMO | Admitting: Cardiology

## 2019-12-18 ENCOUNTER — Other Ambulatory Visit: Payer: Self-pay

## 2019-12-18 ENCOUNTER — Encounter: Payer: Self-pay | Admitting: Neurology

## 2019-12-18 ENCOUNTER — Ambulatory Visit: Payer: Medicare HMO | Admitting: Neurology

## 2019-12-18 VITALS — BP 135/79 | HR 67 | Temp 98.0°F | Ht 67.0 in | Wt 198.0 lb

## 2019-12-18 DIAGNOSIS — R251 Tremor, unspecified: Secondary | ICD-10-CM | POA: Diagnosis not present

## 2019-12-18 NOTE — Patient Instructions (Addendum)
You have a mild hand tremor and have noted improvement with the propranolol. I would recommend, that you continue with it.   I would not recommend adding any new medication from my end of things.   You report that you may have had a brain MRI in the recent past.  Please talk to your primary care about sending [place] a copy, I did not see any MRI report in our system, also in the outside records.  Please remember, that any kind of tremor may be exacerbated by anxiety, anger, nervousness, excitement, dehydration, sleep deprivation, by caffeine, and low blood sugar values or blood sugar fluctuations and thyroid disease. Some medications, especially some antidepressants and lithium can cause or exacerbate tremors.  As discussed, please try to reduce your caffeine intake and limit yourself to 1-2 servings per day and try to hydrate better with water, about 6 to 8 cups of water per day, 8 ounce each if possible.  Since you have difficulty with sleep and a history of snoring, I recommend that you have a sleep study to rule out underlying obstructive sleep apnea.  Please discuss with your primary care nurse practitioner, Salley Slaughter a referral to a local sleep specialist for testing close to your home or if you would like to come here for sleep testing I would be happy to order your sleep study, you can call back if you change your mind.

## 2019-12-18 NOTE — Progress Notes (Signed)
Subjective:    Patient ID: Jadea Shiffer is a 69 y.o. female.  HPI     Huston Foley, MD, PhD [facility] [address], [address] [address] [place], [place] [address]  Dear Marylene Land and Dr. Sherryll Burger,  I saw your patient, Rhealyn Cullen, upon your kind request, in my Neurologic clinic today for initial consultation of her tremor.  The patient is accompanied by her daughter today.  As you know, Ms. Emelda Fear is a 69 year old right-handed woman with an underlying medical history of depression, hyperlipidemia, insomnia, prior smoking, arthritis, anxiety, and obesity, who reports a Bilateral hand tremor for the past 12 to 18 months.  It has been somewhat progressive.  She reports that her father had hand tremors.  He lived to be 70 years old.  She had a total of 3 sisters and 1 brother, 2 sisters passed away.  Patient is divorced and lives alone, she has 1 daughter who lives in [place]. I reviewed your office note from 10/30/2019.  She was started on propranolol low-dose, 10 mg daily.  Of note, she has been on sertraline high-dose 200 mg daily.  She also takes trazodone at night and Restoril 30 mg at night. She had blood work through your office on 10/25/2019 and I reviewed the results: CBC with differential was unremarkable, CMP showed BUN of 19, creatinine 0.79, glucose of 90, and otherwise unremarkable findings, B12 level was 592, TSH 2.03. Of note, her sertraline was increased from 100 mg daily to 200 mg daily some 2 years ago.  She reports bilateral knee pain, mostly on the L at this point, had a right total knee arthroplasty in 2011 and has left knee problems, sees Dr. Merlyn Albert for this.  She quit smoking 10 years ago.  She does not drink a whole lot of water.  May be less than 1 bottle per day, likes to drink soda, 12 ounce bottles, 2 to 3/day and 1 cup of coffee in the morning, she drinks alcohol rarely. She does not sleep very well.  She also reports additional symptoms of intermittent  lightheadedness and dizziness.  She has had memory loss.  Her Past Medical History Is Significant For: Past Medical History:  Diagnosis Date  . Anxiety   . Arthritis   . Depression   . Hyperlipidemia     Her Past Surgical History Is Significant For: Past Surgical History:  Procedure Laterality Date  . CATARACT EXTRACTION W/PHACO Left 04/20/2018   Procedure: CATARACT EXTRACTION PHACO AND INTRAOCULAR LENS PLACEMENT (IOC);  Surgeon: Fabio Pierce, MD;  Location: [facility];  Service: Ophthalmology;  Laterality: Left;  CDE: 4.71  . CATARACT EXTRACTION W/PHACO Right 05/04/2018   Procedure: CATARACT EXTRACTION PHACO AND INTRAOCULAR LENS PLACEMENT (IOC);  Surgeon: Fabio Pierce, MD;  Location: [facility];  Service: Ophthalmology;  Laterality: Right;  CDE: 5.56  . CORRECTION HAMMER TOE Bilateral   . REPLACEMENT TOTAL KNEE Right 2011  . VAGINAL HYSTERECTOMY      Her Family History Is Significant For: Family History  Problem Relation Age of Onset  . Dementia Father   . Emphysema Father   . Seizures Sister   . Alcohol abuse Maternal Grandfather   . Brain cancer Mother   . Blindness Sister     Her Social History Is Significant For: Social History   Socioeconomic History  . Marital status: Divorced    Spouse name: Not on file  . Number of children: 1  . Years of education: Not on file  .  Highest education level: High school graduate  Occupational History  . Not on file  Tobacco Use  . Smoking status: Former Smoker    Packs/day: 1.00    Years: 20.00    Pack years: 20.00    Types: Cigarettes    Quit date: 09/20/2007    Years since quitting: 12.2  . Smokeless tobacco: Never Used  Substance and Sexual Activity  . Alcohol use: Yes    Comment: Occasional  . Drug use: No  . Sexual activity: Not on file  Other Topics Concern  . Not on file  Social History Narrative  . Not on file   Social Determinants of Health   Financial Resource Strain:   . Difficulty of Paying Living Expenses:    Food Insecurity:   . Worried About Programme researcher, broadcasting/film/video in the Last Year:   . Barista in the Last Year:   Transportation Needs:   . Freight forwarder (Medical):   Marland Kitchen Lack of Transportation (Non-Medical):   Physical Activity:   . Days of Exercise per Week:   . Minutes of Exercise per Session:   Stress:   . Feeling of Stress :   Social Connections:   . Frequency of Communication with Friends and Family:   . Frequency of Social Gatherings with Friends and Family:   . Attends Religious Services:   . Active Member of Clubs or Organizations:   . Attends Banker Meetings:   Marland Kitchen Marital Status:     Her Allergies Are:  No Known Allergies:   Her Current Medications Are:  Outpatient Encounter Medications as of 12/18/2019  Medication Sig  . Apoaequorin (PREVAGEN PO) Take 1 tablet by mouth daily.  Marland Kitchen aspirin EC 81 MG tablet Take 81 mg by mouth daily.  . meloxicam (MOBIC) 15 MG tablet Take 15 mg by mouth daily.  Marland Kitchen MISC NATURAL PRODUCTS PO Take 1 tablet by mouth daily. Neuriva for memory  . propranolol (INDERAL) 10 MG tablet Take 10 mg by mouth at bedtime.  . sertraline (ZOLOFT) 100 MG tablet Take 2 tablets (200 mg total) by mouth daily.  . simvastatin (ZOCOR) 20 MG tablet Take 20 mg by mouth daily.  . temazepam (RESTORIL) 30 MG capsule Take 30 mg by mouth at bedtime.  . traZODone (DESYREL) 50 MG tablet Take 50-100 mg by mouth at bedtime.  . [DISCONTINUED] propranolol (INDERAL) 20 MG/5ML solution Take 20 mg by mouth at bedtime.   No facility-administered encounter medications on file as of 12/18/2019.  :   Review of Systems:  Out of a complete 14 point review of systems, all are reviewed and negative with the exception of these symptoms as listed below:  Review of Systems  Neurological:       Pt presents today with with concerns of feeling shaky. This is more noticeable in the hands bilateral. She has had work up completed and sugar is fine and test have been ok.  The daughter witnessed that there was one morning they were working a puzzle and she noticed that she looked like she was going to pass out. She never had LOC but she did become extremely shaky and her daughter said she was weak with walking her to the chair. This type event also happened at work a couple weeks ago and she had to sit down and rest. She states that the tremors have decreased since her PCP started her on propranolol.  She mentioned she struggles with memory concerns but  this seems to be something that has been going on for  While and it not new.     Objective:  Neurological Exam  Physical Exam Physical Examination:   Vitals:   12/18/19 1435  BP: 135/79  Pulse: 67  Temp: 98 F (36.7 C)    General Examination: The patient is a very pleasant 69 y.o. female in no acute distress. She appears well-developed and well-nourished and well groomed.   HEENT: Normocephalic, atraumatic, pupils are equal, round and reactive to light and accommodation. Extraocular tracking is good without limitation to gaze excursion or nystagmus noted. Normal smooth pursuit is noted. Hearing is grossly intact. Face is symmetric with normal facial animation and normal facial sensation. Speech is clear with no dysarthria noted. There is no hypophonia. There is no lip, neck/head, jaw or voice tremor. Neck is supple with full range of passive and active motion. There are no carotid bruits on auscultation. Oropharynx exam reveals: moderate mouth dryness, adequate dental hygiene with dentures on top and moderate airway crowding, due to redundant soft palate, Mallampati class III, tonsils not fully visualized.  Tongue protrudes centrally in palate elevates symmetrically, no orofacial dyskinesias.  Chest: Clear to auscultation without wheezing, rhonchi or crackles noted.  Heart: S1+S2+0, regular and normal without murmurs, rubs or gallops noted.   Abdomen: Soft, non-tender and non-distended with normal bowel sounds  appreciated on auscultation.  Extremities: There is no pitting edema in the distal lower extremities bilaterally.  Skin: Warm and dry without trophic changes noted.  Musculoskeletal: exam reveals L knee pain.   Neurologically:  Mental status: The patient is awake, alert and oriented in all 4 spheres. Her immediate and remote memory, attention, language skills and fund of knowledge are appropriate. There is no evidence of aphasia, agnosia, apraxia or anomia. Speech is clear with normal prosody and enunciation. Thought process is linear. Mood is normal and affect is blunted.  Cranial nerves II - XII are as described above under HEENT exam. In addition: shoulder shrug is normal with equal shoulder height noted. Motor exam: Normal bulk, strength and tone is noted. There is no drift, resting tremor or rebound.  On 12/18/2019: On Archimedes spiral drawing she has slight insecurity with no obvious trembling.  Handwriting is legible, not tremulous, not micrographic.  She has a mild bilateral upper extremity postural tremor, no significant action tremor or intention tremor.  No lateralization noted. Romberg is negative. Reflexes are 2+ throughout. Babinski: Toes are flexor bilaterally. Fine motor skills and coordination: intact with normal finger taps, normal hand movements, normal rapid alternating patting, normal foot taps and normal foot agility.  Cerebellar testing: No dysmetria or intention tremor on finger to nose testing. Heel to shin is unremarkable bilaterally. There is no truncal or gait ataxia.  Sensory exam: intact to light touch, vibration, temperature sense in the upper and lower extremities.  Gait, station and balance: She stands with difficulty. No veering to one side is noted. No leaning to one side is noted. Posture is age-appropriate and stance is narrow based. Gait shows mild limp.   Assessment and Plan:   Assessment and Plan:  In summary, Bama Horace is a very pleasant 69 y.o.-year  old female with an underlying medical history of depression, hyperlipidemia, insomnia, prior smoking, arthritis, anxiety, and obesity, who presents for evaluation of her hand tremors.  She reports a 1 to 1-1/2-year history of hand tremors.  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  Nevertheless, contributing factors may be anxiety, sleep disturbance and sleep deprivation, medication side effect and caffeine.  We talked about supportive treatments.  She has had some symptomatic relief with low-dose propranolol.  She is encouraged to continue with this.  She is encouraged to drink more water to stay better hydrated and reduce her caffeine intake if possible and limit herself to 1-2 servings per day. We talked about sleep.  She is advised to talk to you about seeking testing with a sleep study.  She would prefer this closer to home.  Her daughter had a sleep study.  If patient changes her mind and would like to pursue sleep study testing throughout she is welcome to call back.  She is advised that sometimes medication such as sertraline can cause increase in tremors.  She did have an increase in the dose about 2 years ago she recalls.  This may tie in with her tremor presentation as well.  She is advised to try to stay well rested and well-hydrated and continue with her symptomatic medication for now.  She reports that she may have had a brain MRI through your office.  She is advised to see if she has a report.  If possible, please send [place] a copy of her brain MRI report, she did not have any other obvious abnormality on examination today.  We talked about the importance of healthy lifestyle.  She is advised to follow-up with me on an as-needed basis.  I answered all her questions today and the patient and her daughter were in agreement.    Thank you very much for allowing me to participate in the care of this nice patient. If I can be of any further assistance to you please do  not hesitate to call me at [phone].  Sincerely,   Huston Foley, MD, PhD

## 2020-01-10 ENCOUNTER — Ambulatory Visit: Payer: Medicare HMO | Admitting: Cardiology

## 2020-01-24 ENCOUNTER — Other Ambulatory Visit: Payer: Self-pay | Admitting: Nurse Practitioner

## 2020-01-24 ENCOUNTER — Other Ambulatory Visit: Payer: Self-pay | Admitting: Family Medicine

## 2020-01-24 DIAGNOSIS — Z1231 Encounter for screening mammogram for malignant neoplasm of breast: Secondary | ICD-10-CM

## 2020-02-04 ENCOUNTER — Other Ambulatory Visit: Payer: Self-pay

## 2020-02-04 ENCOUNTER — Ambulatory Visit
Admission: RE | Admit: 2020-02-04 | Discharge: 2020-02-04 | Disposition: A | Discharge status: Home / Self Care | Payer: Medicare HMO | Source: Ambulatory Visit | Attending: Nurse Practitioner | Admitting: Nurse Practitioner

## 2020-02-04 DIAGNOSIS — Z1231 Encounter for screening mammogram for malignant neoplasm of breast: Secondary | ICD-10-CM

## 2020-04-22 ENCOUNTER — Encounter (INDEPENDENT_AMBULATORY_CARE_PROVIDER_SITE_OTHER): Payer: Self-pay | Admitting: Otolaryngology

## 2020-04-22 ENCOUNTER — Other Ambulatory Visit: Payer: Self-pay

## 2020-04-22 ENCOUNTER — Ambulatory Visit (INDEPENDENT_AMBULATORY_CARE_PROVIDER_SITE_OTHER): Payer: Medicare HMO | Admitting: Otolaryngology

## 2020-04-22 VITALS — Temp 97.2°F

## 2020-04-22 DIAGNOSIS — R0602 Shortness of breath: Secondary | ICD-10-CM | POA: Diagnosis not present

## 2020-04-22 DIAGNOSIS — J310 Chronic rhinitis: Secondary | ICD-10-CM

## 2020-04-22 NOTE — Progress Notes (Signed)
HPI: Victoria Adkins is a 69 y.o. female who presents for evaluation of trouble breathing.  She does not feel like she breathes well.  She has seen pulmonary and had test of her lungs and oxygen which is all fine with good oxygenation.  She states that when she walks to her mailbox she is out of breath.  She apparently saw her masseuse that thought it might be secondary to nasal obstruction.  She does not really describe difficulty breathing through her nose.  She used to play a lot of sports at younger age and had trauma to her face but does not report history of nasal fracture.  Past Medical History:  Diagnosis Date  . Anxiety   . Arthritis   . Depression   . Hyperlipidemia    Past Surgical History:  Procedure Laterality Date  . CATARACT EXTRACTION W/PHACO Left 04/20/2018   Procedure: CATARACT EXTRACTION PHACO AND INTRAOCULAR LENS PLACEMENT (IOC);  Surgeon: Fabio Pierce, MD;  Location: [facility];  Service: Ophthalmology;  Laterality: Left;  CDE: 4.71  . CATARACT EXTRACTION W/PHACO Right 05/04/2018   Procedure: CATARACT EXTRACTION PHACO AND INTRAOCULAR LENS PLACEMENT (IOC);  Surgeon: Fabio Pierce, MD;  Location: [facility];  Service: Ophthalmology;  Laterality: Right;  CDE: 5.56  . CORRECTION HAMMER TOE Bilateral   . REPLACEMENT TOTAL KNEE Right 2011  . VAGINAL HYSTERECTOMY     Social History   Socioeconomic History  . Marital status: Divorced    Spouse name: Not on file  . Number of children: 1  . Years of education: Not on file  . Highest education level: High school graduate  Occupational History  . Not on file  Tobacco Use  . Smoking status: Former Smoker    Packs/day: 1.00    Years: 20.00    Pack years: 20.00    Types: Cigarettes    Quit date: 09/20/2007    Years since quitting: 12.5  . Smokeless tobacco: Never Used  Vaping Use  . Vaping Use: Never used  Substance and Sexual Activity  . Alcohol use: Yes    Comment: Occasional  . Drug use: No  . Sexual activity: Not on file   Other Topics Concern  . Not on file  Social History Narrative  . Not on file   Social Determinants of Health   Financial Resource Strain:   . Difficulty of Paying Living Expenses:   Food Insecurity:   . Worried About Programme researcher, broadcasting/film/video in the Last Year:   . Barista in the Last Year:   Transportation Needs:   . Freight forwarder (Medical):   Marland Kitchen Lack of Transportation (Non-Medical):   Physical Activity:   . Days of Exercise per Week:   . Minutes of Exercise per Session:   Stress:   . Feeling of Stress :   Social Connections:   . Frequency of Communication with Friends and Family:   . Frequency of Social Gatherings with Friends and Family:   . Attends Religious Services:   . Active Member of Clubs or Organizations:   . Attends Banker Meetings:   Marland Kitchen Marital Status:    Family History  Problem Relation Age of Onset  . Dementia Father   . Emphysema Father   . Seizures Sister   . Alcohol abuse Maternal Grandfather   . Brain cancer Mother   . Blindness Sister    No Known Allergies Prior to Admission medications   Medication Sig Start Date End Date Taking?  Authorizing Provider  Apoaequorin (PREVAGEN PO) Take 1 tablet by mouth daily.   Yes [provider]  aspirin EC 81 MG tablet Take 81 mg by mouth daily.   Yes [provider]  meloxicam (MOBIC) 15 MG tablet Take 15 mg by mouth daily.   Yes [provider]  propranolol (INDERAL) 10 MG tablet Take 10 mg by mouth at bedtime. 10/30/19  Yes [provider]  sertraline (ZOLOFT) 100 MG tablet Take 2 tablets (200 mg total) by mouth daily. 05/18/18  Yes Hisada, Barbee Cough, MD  simvastatin (ZOCOR) 20 MG tablet Take 20 mg by mouth daily.   Yes [provider]  temazepam (RESTORIL) 30 MG capsule Take 30 mg by mouth at bedtime.   Yes [provider]  MISC NATURAL PRODUCTS PO Take 1 tablet by mouth daily. Neuriva for memory    [provider]  traZODone  (DESYREL) 50 MG tablet Take 50-100 mg by mouth at bedtime.    [provider]     Positive ROS: Otherwise negative  All other systems have been reviewed and were otherwise negative with the exception of those mentioned in the HPI and as above.  Physical Exam: Constitutional: Alert, well-appearing, no acute distress Ears: External ears without lesions or tenderness. Ear canals are clear bilaterally with intact, clear TMs.  Nasal: External nose without lesions. Septum with minimal deformity.  Mild rhinitis..  Both middle meatus regions were clear with no signs of infection.  There were no polyps and no obstructing lesions noted within the nasal cavity. Oral: Lips and gums without lesions. Tongue and palate mucosa without lesions. Posterior oropharynx clear. Neck: No palpable adenopathy or masses Respiratory: Breathing comfortably  Skin: No facial/neck lesions or rash noted.  Procedures  Assessment: Shortness of breath with activity is not related to nasal obstruction. Nasal passages are relatively clear bilaterally with mild rhinitis  Plan: Suggested use of Nasacort 2 sprays each nostril at night as this will help improve her nasal airway. I discussed whether that the shortness of breath when she is active is more related to her stamina than to any anatomical obstructing problems in her nose. She will follow-up as needed  Narda Bonds, MD

## 2020-04-28 ENCOUNTER — Ambulatory Visit: Payer: Medicare HMO | Admitting: Internal Medicine

## 2020-07-21 ENCOUNTER — Encounter: Payer: Self-pay | Admitting: Internal Medicine

## 2020-07-21 ENCOUNTER — Other Ambulatory Visit: Payer: Self-pay

## 2020-07-21 ENCOUNTER — Ambulatory Visit: Payer: Medicare HMO | Admitting: Internal Medicine

## 2020-07-21 DIAGNOSIS — J449 Chronic obstructive pulmonary disease, unspecified: Secondary | ICD-10-CM | POA: Diagnosis not present

## 2020-07-21 NOTE — Assessment & Plan Note (Addendum)
Quit smoking 2009  Spirometry 06/26/2018  FEV1 0.8 (32%)  Ratio 56  - 06/26/2018  After extensive coaching inhaler device,  effectiveness =    90% with elipta > try anoro   - alpha one AT screen 06/26/2018   MM level 150  - 07/24/2018  After extensive coaching inhaler device,  effectiveness =   75% with hfa and 90% with dpi  - 07/24/2018 rec rehab but wants to try on her own  - 07/21/2020  After extensive coaching inhaler device,  effectiveness =    90% with elipta > ok to try trelegy one each am if tolerates tremulousness and benefit mb to house walking    Group D in terms of symptom/risk and laba/lama/ICS  therefore appropriate rx at this point >>>  trelegy or breztri appropriate at this point     If doesn't tol the shakiness could go with one puff of breztri bid   Avoid saba if possible as will just make tremor worse.         Each maintenance medication was reviewed in detail including emphasizing most importantly the difference between maintenance and prns and under what circumstances the prns are to be triggered using an action plan format where appropriate.  Total time for H and P, chart review, counseling, teaching device and generating customized AVS unique to this office visit / charting > 30 min

## 2020-07-21 NOTE — Progress Notes (Signed)
Victoria Adkins, female    DOB: 16-May-1951  MRN: [id number]   Brief patient profile:  69 yowf quit smoking 2009 with cough issues then and  not really sob and 100% back to nl at wt 140 but gained wt x 2017 and breathing gradually worse since with   to point where doe x 50 ft to mailbox and stop / pushes buggy to get around at [employer] so self-referred to pulmonary clinic with GOLD III copd criteria 06/26/2018    06/26/2018  Pulmonary/ 1st pulmonary eval/ Victoria Adkins Chief Complaint  Patient presents with  . Pulmonary Consult    Self referral. Pt c/o DOE for the past several months. She gets winded with light housework and walking to the mailbox.   Dyspnea:  As above = MMRC3 = can't walk 100 yards even at a slow pace at a flat grade s stopping due to sob   Cough: none Sleep: flat bed / one pillow SABA use: did not help  rec Anoro one click each am - fill the rx if you feel it's helping but keep walking as much as possible    07/24/2018  f/u ov/Victoria Adkins re:  Copd GOLD III  Improved but concerned about cost of anoro/access under her insurance  Chief Complaint  Patient presents with  . Follow-up    Breathing has improved some. She has not needed her albuterol inhaler.   Dyspnea:  MMRC2 = can't walk a nl pace on a flat grade s sob but does fine slow and flat  Cough: none Sleeping: flat bed/ one pillow SABA use: none  02: none   rec Plan A = Automatic =  Anoro one click each am  OR  Bevespi Take 2 puffs first thing in am and then another 2 puffs about 12 hours later.  Work on inhaler technique:   Plan B = Backup Only use your albuterol as a rescue medication  To get the most out of exercise, you need to be continuously aware that you are short of breath, but never out of breath, for 30 minutes daily  Please schedule a follow up visit in 3 months but call sooner if needed  with all medications /inhalers/ solutions in hand so we can verify exactly what you are taking. This includes all medications from  all doctors and over the counters  -  PFT's on return     07/21/2020  f/u ov/Victoria Adkins re: copd GOLD III  on propranolol  And starting trelegy today  Chief Complaint  Patient presents with  . Follow-up    shortness of breath with activity   Dyspnea:  Uses hc parking and shops [employer]  MMRC2 = can't walk a nl pace on a flat grade s sob but does fine slow and flat  / mb and back multiple times x up to 10 min  Cough: x one month  Sleeping: ok p temazepam  SABA use: none 02: none    No obvious day to day or daytime variability or assoc excess/ purulent sputum or mucus plugs or hemoptysis or cp or chest tightness, subjective wheeze or overt sinus or hb symptoms.   Sleeping fine  without nocturnal  or early am exacerbation  of respiratory  c/o's or need for noct saba. Also denies any obvious fluctuation of symptoms with weather or environmental changes or other aggravating or alleviating factors except as outlined above   No unusual exposure hx or h/o childhood pna/ asthma or knowledge of premature birth.  Current Allergies, Complete Past Medical History, Past Surgical History, Family History, and Social History were reviewed in [employer] record.  ROS  The following are not active complaints unless bolded Hoarseness, sore throat, dysphagia, dental problems, itching, sneezing,  nasal congestion or discharge of excess mucus or purulent secretions, ear ache,   fever, chills, sweats, unintended wt loss or wt gain, classically pleuritic or exertional cp,  orthopnea pnd or arm/hand swelling  or leg swelling, presyncope, palpitations, abdominal pain, anorexia, nausea, vomiting, diarrhea  or change in bowel habits or change in bladder habits, change in stools or change in urine, dysuria, hematuria,  rash, arthralgias, visual complaints, headache, numbness, weakness or ataxia or problems with walking or coordination,  change in mood or  memory.        Current Meds  Medication Sig    . Apoaequorin (PREVAGEN PO) Take 1 tablet by mouth daily.  Marland Kitchen aspirin EC 81 MG tablet Take 81 mg by mouth daily.  . Fluticasone-Umeclidin-Vilant (TRELEGY ELLIPTA) 100-62.5-25 MCG/INH AEPB Inhale 1 puff into the lungs.  . meloxicam (MOBIC) 15 MG tablet Take 15 mg by mouth daily.  . propranolol (INDERAL) 10 MG tablet Not taking   . sertraline (ZOLOFT) 100 MG tablet Take 2 tablets (200 mg total) by mouth daily.  . simvastatin (ZOCOR) 20 MG tablet Take 20 mg by mouth daily.  . temazepam (RESTORIL) 30 MG capsule Take 30 mg by mouth at bedtime.                           Objective:     amb wf nad    07/21/2020       196   07/24/18 210 lb (95.3 kg)  06/26/18 208 lb 12.8 oz (94.7 kg)  05/04/18 203 lb (92.1 kg)      Vital signs reviewed  07/21/2020  - Note at rest 02 sats  96% on RA    Top denture   HEENT : pt wearing mask not removed for exam due to covid -19 concerns.    NECK :  without JVD/Nodes/TM/ nl carotid upstrokes bilaterally   LUNGS: no acc muscle use,  Mod barrel  contour chest wall with bilateral  Distant bs s audible wheeze and  without cough on insp or exp maneuvers and mod  Hyperresonant  to  percussion bilaterally     CV:  RRR  no s3 or murmur or increase in P2, and no edema   ABD:  soft and nontender with pos mid insp Hoover's  in the supine position. No bruits or organomegaly appreciated, bowel sounds nl  MS:     ext warm without deformities, calf tenderness, cyanosis or clubbing No obvious joint restrictions   SKIN: warm and dry without lesions    NEURO:  alert, approp, nl sensorium with  no motor or cerebellar deficits apparent.                 Assessment

## 2020-07-21 NOTE — Patient Instructions (Addendum)
Trelegy one click each am - rinse and gargle after wards  - arm and hammer toothpaste if needed   Keep up the walking as much as possible to see if the medication helps your activity tolerance    Please schedule a follow up visit in 6 months but call sooner if needed if not happy with the trelegy.

## 2020-08-11 ENCOUNTER — Telehealth: Payer: Self-pay | Admitting: Orthopedic Surgery

## 2020-08-11 NOTE — Telephone Encounter (Signed)
Call from patient inquiring about seeing Dr Romeo Apple for immediate appointment for knee pain - states had knee replacement by orthopaedist who is retiring. Relayed to patient that Dr Romeo Apple is out of office through 08/17/20, and first opening would be in early December. Per notes, patient has seen Dr Lequita Halt for both knees. Patient will call there or call ortho office in [place].

## 2020-12-08 ENCOUNTER — Other Ambulatory Visit: Payer: Self-pay | Admitting: Family Medicine

## 2020-12-08 DIAGNOSIS — Z1231 Encounter for screening mammogram for malignant neoplasm of breast: Secondary | ICD-10-CM

## 2021-02-05 ENCOUNTER — Other Ambulatory Visit: Payer: Self-pay

## 2021-02-05 ENCOUNTER — Ambulatory Visit
Admission: RE | Admit: 2021-02-05 | Discharge: 2021-02-05 | Disposition: A | Discharge status: Home / Self Care | Payer: Medicare HMO | Source: Ambulatory Visit | Attending: Family Medicine | Admitting: Family Medicine

## 2021-02-05 DIAGNOSIS — Z1231 Encounter for screening mammogram for malignant neoplasm of breast: Secondary | ICD-10-CM

## 2021-02-10 ENCOUNTER — Encounter: Payer: Self-pay | Admitting: Orthopedic Surgery

## 2021-02-10 ENCOUNTER — Ambulatory Visit: Payer: Medicare HMO | Admitting: Orthopedic Surgery

## 2021-02-10 ENCOUNTER — Other Ambulatory Visit: Payer: Self-pay

## 2021-02-10 ENCOUNTER — Ambulatory Visit: Payer: Medicare HMO

## 2021-02-10 VITALS — BP 99/61 | HR 74 | Ht 67.0 in | Wt 215.0 lb

## 2021-02-10 DIAGNOSIS — M6748 Ganglion, other site: Secondary | ICD-10-CM | POA: Diagnosis not present

## 2021-02-10 DIAGNOSIS — M6740 Ganglion, unspecified site: Secondary | ICD-10-CM

## 2021-02-10 DIAGNOSIS — R2231 Localized swelling, mass and lump, right upper limb: Secondary | ICD-10-CM

## 2021-02-10 MED ORDER — ROPINIROLE HCL 0.25 MG PO TABS: 0.2500 mg | ORAL_TABLET | Freq: Every day | ORAL | 0 refills | Status: DC

## 2021-02-10 NOTE — Progress Notes (Signed)
New Patient Visit  Assessment: Victoria Adkins is a 70 y.o. female with the following: Ganglion cyst, R thumb IP joint  Plan: Patient previously cyst decompressed and excised by her dermatologist, approximately 4 months ago.  Unfortunately it has returned.  We discussed all treatment options, and the likelihood of recurrence.  Previous procedure likely did not remove the stalk, or associated features at the joint.  Surgery would allow [place] to be more aggressive and remove the cyst as well as clean up the joint a little bit.  Otherwise, we can aspirate the cyst in clinic today.  She has elected to aspirate the cyst and monitor for recurrence.  If the cyst returns in a relatively short time frame, I have recommended surgical excision.  She stated her understanding.   Patient complains of restless leg syndrome, and is requesting Requip.  I have provide her with a prescription as she currently does not have a PCP.  It is the lowest dose.  If she would like to change doses, or get a refill, she will have to establish care through a new PCP.   Procedure: R thumb cyst aspiration  Correct patient and extremity confirmed.  Verbal consent obtained. Right thumb cleaned with an alcohol swab. Ethyl chloride spray used to provide analgesia 18 gauge needle introduced directly into middle of cyst.   Approximately 1 cc of thick, gelatinous material aspirated. The cyst was adequately decompressed.  Some redundant skin remained.  Hemostasis was achieved.   Compression dressing applied. She tolerated procedure well   Follow-up: Return if symptoms worsen or fail to improve.  Subjective:  Chief Complaint  Patient presents with  . Hand Pain    Nodule on Rt thumb for 3 months. Pt states she had surgery 4 months ago to have it removed.     History of Present Illness: Victoria Adkins is a 70 y.o. female who presents for evaluation of right thumb pain and swelling.  She has had a nodule on her thumb for the  past 3 months.  This was present prior to that as she previously had the cyst removed in her dermatologists office.  The nodule did not recur for about a month.  Since then, it has grown and is prominent.  It is painful when she bumps the growth.  No drainage.  No previous injury.  She does not like the wa it looks.    Review of Systems: No fevers or chills No numbness or tingling No chest pain No shortness of breath No bowel or bladder dysfunction No GI distress No headaches   Medical History:  Past Medical History:  Diagnosis Date  . Anxiety   . Arthritis   . Depression   . Hyperlipidemia     Past Surgical History:  Procedure Laterality Date  . CATARACT EXTRACTION W/PHACO Left 04/20/2018   Procedure: CATARACT EXTRACTION PHACO AND INTRAOCULAR LENS PLACEMENT (IOC);  Surgeon: Fabio Pierce, MD;  Location: [facility];  Service: Ophthalmology;  Laterality: Left;  CDE: 4.71  . CATARACT EXTRACTION W/PHACO Right 05/04/2018   Procedure: CATARACT EXTRACTION PHACO AND INTRAOCULAR LENS PLACEMENT (IOC);  Surgeon: Fabio Pierce, MD;  Location: [facility];  Service: Ophthalmology;  Laterality: Right;  CDE: 5.56  . CORRECTION HAMMER TOE Bilateral   . REPLACEMENT TOTAL KNEE Right 2011  . VAGINAL HYSTERECTOMY      Family History  Problem Relation Age of Onset  . Dementia Father   . Emphysema Father   . Seizures Sister   .  Alcohol abuse Maternal Grandfather   . Brain cancer Mother   . Blindness Sister    Social History   Tobacco Use  . Smoking status: Former Smoker    Packs/day: 1.00    Years: 20.00    Pack years: 20.00    Types: Cigarettes    Quit date: 09/20/2007    Years since quitting: 13.4  . Smokeless tobacco: Never Used  Vaping Use  . Vaping Use: Never used  Substance Use Topics  . Alcohol use: Yes    Comment: Occasional  . Drug use: No    No Known Allergies  Current Meds  Medication Sig  . Apoaequorin (PREVAGEN PO) Take 1 tablet by mouth daily.  .  Fluticasone-Umeclidin-Vilant (TRELEGY ELLIPTA) 100-62.5-25 MCG/INH AEPB Inhale 1 puff into the lungs.  . meloxicam (MOBIC) 15 MG tablet Take 15 mg by mouth daily.  . propranolol (INDERAL) 10 MG tablet Take 10 mg by mouth at bedtime.  Marland Kitchen rOPINIRole (REQUIP) 0.25 MG tablet Take 1 tablet (0.25 mg total) by mouth at bedtime.  . sertraline (ZOLOFT) 100 MG tablet Take 2 tablets (200 mg total) by mouth daily.  . simvastatin (ZOCOR) 20 MG tablet Take 20 mg by mouth daily.  . temazepam (RESTORIL) 30 MG capsule Take 30 mg by mouth at bedtime.    Objective: BP 99/61   Pulse 74   Ht 5\' 7"  (1.702 m)   Wt 215 lb (97.5 kg)   BMI 33.67 kg/m   Physical Exam:  General: Alert and oriented. No acute distress Gait:  Normal  Right hand with swelling over dorsal IP joint.  Growth is firm, but compressible.  TTP.  No surrounding erythema.  She has full ROM of her IP joint.  Rest of hand without deformity.  Sensation intact throughout.     IMAGING: I personally ordered and reviewed the following images  XR of the left hand without acute injury.  Moderate degenerative changes at the thumb IP joint.  Soft tissue swelling in this area.  No other abnormality noted.   Impression: moderate arthritis with soft tissue swelling over IP joint of right thumb.   New Medications:  Meds ordered this encounter  Medications  . rOPINIRole (REQUIP) 0.25 MG tablet    Sig: Take 1 tablet (0.25 mg total) by mouth at bedtime.    Dispense:  30 tablet    Refill:  0      , MD  02/10/2021 10:51 PM

## 2021-03-01 ENCOUNTER — Other Ambulatory Visit: Payer: Self-pay | Admitting: Orthopedic Surgery

## 2021-03-01 ENCOUNTER — Telehealth: Payer: Self-pay | Admitting: Orthopedic Surgery

## 2021-03-01 NOTE — Telephone Encounter (Signed)
Patient called states her thumb is now turning purple.  She said it doesn't hurt but she is afraid of infection.    I added her to your schedule in the AM.    Will this be ok?

## 2021-03-02 ENCOUNTER — Ambulatory Visit (INDEPENDENT_AMBULATORY_CARE_PROVIDER_SITE_OTHER): Payer: Medicare HMO | Admitting: Orthopedic Surgery

## 2021-03-02 ENCOUNTER — Encounter: Payer: Self-pay | Admitting: Orthopedic Surgery

## 2021-03-02 ENCOUNTER — Other Ambulatory Visit: Payer: Self-pay

## 2021-03-02 VITALS — BP 129/64 | HR 78 | Ht 67.0 in | Wt 219.0 lb

## 2021-03-02 DIAGNOSIS — M6740 Ganglion, unspecified site: Secondary | ICD-10-CM

## 2021-03-02 NOTE — Patient Instructions (Signed)
Plan for surgery 03/18/21

## 2021-03-02 NOTE — Progress Notes (Signed)
Orthopaedic Clinic Return  Assessment: Victoria Adkins is a 70 y.o. female with the following: Ganglion cyst, right thumb IP joint  Plan: Patient return to clinic today for repeat evaluation of the cyst on her right thumb.  She has previously had this decompressed by dermatologist, with recurrence.  At the last visit, the cyst was aspirated, approximately 2 weeks ago.  Furthermore, there is been some slight discoloration of the cyst, as it looks like there may have been a small bleed into the cyst following the procedure in clinic.  At this point, I do not feel as though additional aspirations would be beneficial.  She is in agreement with this.  She is interested in proceeding with surgery.  If possible, she would like to avoid general anesthesia.  I do think this is reasonable.  We can plan for a digital block in the OR, with sedation.  This should be sufficient for excision of the cyst and evaluation of the IP joint.  Risks and benefits of the surgery, including, but not limited to infection, bleeding, persistent pain, need for further surgery, and more severe complications associated with anesthesia were discussed with the patient.  The patient has elected to proceed.  Surgical Plan  Procedure: Cyst excision from dorsal right thumb Disposition: Outpatient Anesthesia: Digital nerve block, with sedation Medical Comorbidities: Anxiety Notes: Digital nerve block can be completed in the operating room, prior to sterile prep and drape   Body mass index is 34.3 kg/m.  Follow-up: Return for After surgery.   Subjective:  Chief Complaint  Patient presents with   Hand Pain    Cyst on thumb is now purple. Pt states it's not hot/cold to the touch but is full and hard again.     History of Present Illness: Victoria Adkins is a 70 y.o. female who returns to clinic today for repeat evaluation of the cyst on her right thumb.  She was last seen in clinic approximately 2 weeks ago, at which time  the cyst was aspirated.  At the time of the procedure, we noted excellent decompression of the cyst.  However, in a short period of time the cyst as returned.  It is now firm and painful once again.  Moreover, she has noticed a purple appearance of the cyst.  She denies worsening pain.  No drainage.  She is not concerned about an infection.  No fevers or chills.  Review of Systems: No fevers or chills No numbness or tingling No chest pain No shortness of breath No bowel or bladder dysfunction No GI distress No headaches   Objective: BP 129/64   Pulse 78   Ht 5\' 7"  (1.702 m)   Wt 219 lb (99.3 kg)   BMI 34.30 kg/m   Physical Exam:  Alert and oriented.  No acute distress.  Gait is normal  Evaluation of the right hand demonstrates recurrence of the ganglion cyst to the dorsal aspect of the right thumb IP joint.  A small scab remains at the site of the aspiration.  The cyst does have a purple hue to it.  No drainage.  No surrounding erythema.  The thumb is not warm to touch.  She has full range of motion to the right thumb.  Sensation is intact distally.  IMAGING: I personally ordered and reviewed the following images:   , MD 03/02/2021 9:47 PM

## 2021-03-12 NOTE — Patient Instructions (Signed)
Victoria Adkins  03/12/2021     @PREFPERIOPPHARMACY @   Your procedure is scheduled on  03/18/2021.   Report to 03/20/2021 at  [phone]  A.M.   Call this number if you have problems the morning of surgery:  [phone]   Remember:  Do not eat after midnight.  You may drink clear liquids until 0330 AM .  Clear liquids allowed are:                    Water, Juice (non-citric and without pulp - diabetics please choose diet or no sugar options), Carbonated beverages - (diabetics please choose diet or no sugar options), Clear Tea, Black Coffee only (no creamer, milk or cream including half and half), Plain Jell-O only (diabetics please choose diet or no sugar options), Gatorade (diabetics please choose diet or no sugar options), and Plain Popsicles only   At 0330 am, drink your carb drink and then nothing else to drink after that.    Take these medicines the morning of surgery with A SIP OF WATER  mobic (if needed), zoloft.     Please brush your teeth.  Do not wear jewelry, make-up or nail polish.  Do not wear lotions, powders, or perfumes, or deodorant.  Do not shave 48 hours prior to surgery.  Men may shave face and neck.  Do not bring valuables to the hospital.  [facility] is not responsible for any belongings or valuables.  Contacts, dentures or bridgework may not be worn into surgery.  Leave your suitcase in the car.  After surgery it may be brought to your room.  For patients admitted to the hospital, discharge time will be determined by your treatment team.  Patients discharged the day of surgery will not be allowed to drive home and must have someone with them for 24 hours.    Special instructions:   DO NOT smoke tobacco or vape for 24 hours before your procedure.  Please read over the following fact sheets that you were given. Coughing and Deep Breathing, Surgical Site Infection Prevention, Anesthesia Post-op Instructions, and Care and Recovery After  Surgery      Ganglion Cyst Removal, Care After This sheet gives you information about how to care for yourself after your procedure. Your health care provider may also give you more specific instructions. If you have problems or questions, contact your health careprovider. What can I expect after the procedure? After the procedure, it is common to have: Soreness. Swelling. Stiffness. A splint or brace. Follow these instructions at home: Medicines Take over-the-counter and prescription medicines only as told by your health care provider. Ask your health care provider if the medicine prescribed to you requires you to avoid driving or using machinery. Incision care  Follow instructions from your health care provider about how to take care of your incision or incisions. Make sure you: Wash your hands with soap and water for at least 20 seconds before and after you change your bandage (dressing). If soap and water are not available, use hand sanitizer. Change and remove your dressing as told by your health care provider. Leave stitches (sutures), skin glue, or adhesive strips in place. These skin closures may need to stay in place for 2 weeks or longer. If adhesive strip edges start to loosen and curl up, you may trim the loose edges. Do not remove adhesive strips completely unless your health care provider tells you to do  that. Check your incision area every day for signs of infection. Check for: Redness, swelling, or pain. Fluid or blood. Warmth. Pus or a bad smell. Do not take baths, swim, or use a hot tub until your health care provider approves.  If you have a splint or brace: Wear it as told by your health care provider. Remove it only as told by your health care provider. You may need to wear the splint or brace for several days. Loosen the splint or brace if your fingers (or toes) tingle, become numb, or turn cold and blue. Keep the splint or brace clean. If the splint or brace is  not waterproof: Do not let it get wet. Ask if you can remove it for bathing. If not, cover it with a watertight covering when you take a bath or shower. Managing pain, stiffness, and swelling  If directed, put ice on the incision area. To do this: If you have a removable splint or brace, remove it as told by your health care provider. Put ice in a plastic bag. Place a towel between your skin and the bag or between the splint or brace and the bag. Leave the ice on for 20 minutes, 2-3 times a day. Move your fingers (or toes) often to reduce stiffness and swelling. Raise (elevate) the affected area above the level of your heart while you are sitting or lying down.  Activity Return to your normal activities as told by your health care provider. Ask your health care provider what activities are safe for you. Avoid activities that cause pain. Ask your health care provider when it is safe to drive and when you should start doing movement exercises. General instructions Do not use any products that contain nicotine or tobacco, such as cigarettes, e-cigarettes, and chewing tobacco. These can delay healing. If you need help quitting, ask your health care provider. Keep all follow-up visits as told by your health care provider. This is important. Contact a health care provider if: Medicine is not relieving your pain. You have stiffness or swelling that gets worse. Your splint or brace is not fitting correctly, causing your fingers (or toes) to tingle, become numb, or turn cold and blue. You have any signs of infection at your incision site. You have a fever. Summary After the procedure, you can expect to have some soreness, stiffness, and swelling. You may need to wear a splint or brace for a few days. Follow instructions for changing your dressing and checking your incision area for signs of infection. Contact your health care provider if you have a fever, signs of infection, stiffness or  swelling that gets worse, or continuing pain, tingling, or numbness. This information is not intended to replace advice given to you by your health care provider. Make sure you discuss any questions you have with your healthcare provider. Document Revised: 11/29/2019 Document Reviewed: 11/29/2019 Elsevier Patient Education  2022 [employer] Monitored Anesthesia Care, Care After This sheet gives you information about how to care for yourself after your procedure. Your health care provider may also give you more specific instructions. If you have problems or questions, contact your health careprovider. What can I expect after the procedure? After the procedure, it is common to have: Tiredness. Forgetfulness about what happened after the procedure. Impaired judgment for important decisions. Nausea or vomiting. Some difficulty with balance. Follow these instructions at home: For the time period you were told by your health care provider:     Rest as  needed. Do not participate in activities where you could fall or become injured. Do not drive or use machinery. Do not drink alcohol. Do not take sleeping pills or medicines that cause drowsiness. Do not make important decisions or sign legal documents. Do not take care of children on your own. Eating and drinking Follow the diet that is recommended by your health care provider. Drink enough fluid to keep your urine pale yellow. If you vomit: Drink water, juice, or soup when you can drink without vomiting. Make sure you have little or no nausea before eating solid foods. General instructions Have a responsible adult stay with you for the time you are told. It is important to have someone help care for you until you are awake and alert. Take over-the-counter and prescription medicines only as told by your health care provider. If you have sleep apnea, surgery and certain medicines can increase your risk for breathing problems. Follow  instructions from your health care provider about wearing your sleep device: Anytime you are sleeping, including during daytime naps. While taking prescription pain medicines, sleeping medicines, or medicines that make you drowsy. Avoid smoking. Keep all follow-up visits as told by your health care provider. This is important. Contact a health care provider if: You keep feeling nauseous or you keep vomiting. You feel light-headed. You are still sleepy or having trouble with balance after 24 hours. You develop a rash. You have a fever. You have redness or swelling around the IV site. Get help right away if: You have trouble breathing. You have new-onset confusion at home. Summary For several hours after your procedure, you may feel tired. You may also be forgetful and have poor judgment. Have a responsible adult stay with you for the time you are told. It is important to have someone help care for you until you are awake and alert. Rest as told. Do not drive or operate machinery. Do not drink alcohol or take sleeping pills. Get help right away if you have trouble breathing, or if you suddenly become confused. This information is not intended to replace advice given to you by your health care provider. Make sure you discuss any questions you have with your healthcare provider. Document Revised: 05/21/2020 Document Reviewed: 08/08/2019 Elsevier Patient Education  2022 [employer] How to Use Chlorhexidine for Bathing Chlorhexidine gluconate (CHG) is a germ-killing (antiseptic) solution that is used to clean the skin. It can get rid of the bacteria that normally live on the skin and can keep them away for about 24 hours. To clean your skin with CHG, you may be given: A CHG solution to use in the shower or as part of a sponge bath. A prepackaged cloth that contains CHG. Cleaning your skin with CHG may help lower the risk for infection: While you are staying in the intensive care unit of the  hospital. If you have a vascular access, such as a central line, to provide short-term or long-term access to your veins. If you have a catheter to drain urine from your bladder. If you are on a ventilator. A ventilator is a machine that helps you breathe by moving air in and out of your lungs. After surgery. What are the risks? Risks of using CHG include: A skin reaction. Hearing loss, if CHG gets in your ears. Eye injury, if CHG gets in your eyes and is not rinsed out. The CHG product catching fire. Make sure that you avoid smoking and flames after applying CHG to your  skin. Do not use CHG: If you have a chlorhexidine allergy or have previously reacted to chlorhexidine. On babies younger than 49 months of age. How to use CHG solution Use CHG only as told by your health care provider, and follow the instructions on the label. Use the full amount of CHG as directed. Usually, this is one bottle. During a shower Follow these steps when using CHG solution during a shower (unless your health care provider gives you different instructions): Start the shower. Use your normal soap and shampoo to wash your face and hair. Turn off the shower or move out of the shower stream. Pour the CHG onto a clean washcloth. Do not use any type of brush or rough-edged sponge. Starting at your neck, lather your body down to your toes. Make sure you follow these instructions: If you will be having surgery, pay special attention to the part of your body where you will be having surgery. Scrub this area for at least 1 minute. Do not use CHG on your head or face. If the solution gets into your ears or eyes, rinse them well with water. Avoid your genital area. Avoid any areas of skin that have broken skin, cuts, or scrapes. Scrub your back and under your arms. Make sure to wash skin folds. Let the lather sit on your skin for 1-2 minutes or as long as told by your health care provider. Thoroughly rinse your entire  body in the shower. Make sure that all body creases and crevices are rinsed well. Dry off with a clean towel. Do not put any substances on your body afterward--such as powder, lotion, or perfume--unless you are told to do so by your health care provider. Only use lotions that are recommended by the manufacturer. Put on clean clothes or pajamas. If it is the night before your surgery, sleep in clean sheets.  During a sponge bath Follow these steps when using CHG solution during a sponge bath (unless your health care provider gives you different instructions): Use your normal soap and shampoo to wash your face and hair. Pour the CHG onto a clean washcloth. Starting at your neck, lather your body down to your toes. Make sure you follow these instructions: If you will be having surgery, pay special attention to the part of your body where you will be having surgery. Scrub this area for at least 1 minute. Do not use CHG on your head or face. If the solution gets into your ears or eyes, rinse them well with water. Avoid your genital area. Avoid any areas of skin that have broken skin, cuts, or scrapes. Scrub your back and under your arms. Make sure to wash skin folds. Let the lather sit on your skin for 1-2 minutes or as long as told by your health care provider. Using a different clean, wet washcloth, thoroughly rinse your entire body. Make sure that all body creases and crevices are rinsed well. Dry off with a clean towel. Do not put any substances on your body afterward--such as powder, lotion, or perfume--unless you are told to do so by your health care provider. Only use lotions that are recommended by the manufacturer. Put on clean clothes or pajamas. If it is the night before your surgery, sleep in clean sheets. How to use CHG prepackaged cloths Only use CHG cloths as told by your health care provider, and follow the instructions on the label. Use the CHG cloth on clean, dry skin. Do not use  the  CHG cloth on your head or face unless your health care provider tells you to. When washing with the CHG cloth: Avoid your genital area. Avoid any areas of skin that have broken skin, cuts, or scrapes. Before surgery Follow these steps when using a CHG cloth to clean before surgery (unless your health care provider gives you different instructions): Using the CHG cloth, vigorously scrub the part of your body where you will be having surgery. Scrub using a back-and-forth motion for 3 minutes. The area on your body should be completely wet with CHG when you are done scrubbing. Do not rinse. Discard the cloth and let the area air-dry. Do not put any substances on the area afterward, such as powder, lotion, or perfume. Put on clean clothes or pajamas. If it is the night before your surgery, sleep in clean sheets.  For general bathing Follow these steps when using CHG cloths for general bathing (unless your health care provider gives you different instructions). Use a separate CHG cloth for each area of your body. Make sure you wash between any folds of skin and between your fingers and toes. Wash your body in the following order, switching to a new cloth after each step: The front of your neck, shoulders, and chest. Both of your arms, under your arms, and your hands. Your stomach and groin area, avoiding the genitals. Your right leg and foot. Your left leg and foot. The back of your neck, your back, and your buttocks. Do not rinse. Discard the cloth and let the area air-dry. Do not put any substances on your body afterward--such as powder, lotion, or perfume--unless you are told to do so by your health care provider. Only use lotions that are recommended by the manufacturer. Put on clean clothes or pajamas. Contact a health care provider if: Your skin gets irritated after scrubbing. You have questions about using your solution or cloth. Get help right away if: Your eyes become very red or  swollen. Your eyes itch badly. Your skin itches badly and is red or swollen. Your hearing changes. You have trouble seeing. You have swelling or tingling in your mouth or throat. You have trouble breathing. You swallow any chlorhexidine. Summary Chlorhexidine gluconate (CHG) is a germ-killing (antiseptic) solution that is used to clean the skin. Cleaning your skin with CHG may help to lower your risk for infection. You may be given CHG to use for bathing. It may be in a bottle or in a prepackaged cloth to use on your skin. Carefully follow your health care provider's instructions and the instructions on the product label. Do not use CHG if you have a chlorhexidine allergy. Contact your health care provider if your skin gets irritated after scrubbing. This information is not intended to replace advice given to you by your health care provider. Make sure you discuss any questions you have with your healthcare provider. Document Revised: 01/17/2020 Document Reviewed: 02/21/2020 Elsevier Patient Education  2022 [employer].

## 2021-03-16 ENCOUNTER — Encounter (HOSPITAL_COMMUNITY): Payer: Self-pay | Admitting: Anesthesiology

## 2021-03-16 ENCOUNTER — Other Ambulatory Visit: Payer: Self-pay

## 2021-03-16 ENCOUNTER — Encounter (HOSPITAL_COMMUNITY)
Admission: RE | Admit: 2021-03-16 | Discharge: 2021-03-16 | Disposition: A | Discharge status: Home / Self Care | Payer: Medicare HMO | Source: Ambulatory Visit | Attending: Orthopedic Surgery | Admitting: Orthopedic Surgery

## 2021-03-16 ENCOUNTER — Encounter (HOSPITAL_COMMUNITY): Payer: Self-pay

## 2021-03-16 DIAGNOSIS — Z01818 Encounter for other preprocedural examination: Secondary | ICD-10-CM | POA: Insufficient documentation

## 2021-03-16 HISTORY — DX: Chronic obstructive pulmonary disease, unspecified: J44.9

## 2021-03-16 LAB — CBC
HCT: 25.9 % — ABNORMAL LOW (ref 36.0–46.0)
Hemoglobin: 6.9 g/dL — CL (ref 12.0–15.0)
MCH: 19.1 pg — ABNORMAL LOW (ref 26.0–34.0)
MCHC: 26.6 g/dL — ABNORMAL LOW (ref 30.0–36.0)
MCV: 71.7 fL — ABNORMAL LOW (ref 80.0–100.0)
Platelets: 165 10*3/uL (ref 150–400)
RBC: 3.61 MIL/uL — ABNORMAL LOW (ref 3.87–5.11)
RDW: 18.9 % — ABNORMAL HIGH (ref 11.5–15.5)
WBC: 9.5 10*3/uL (ref 4.0–10.5)
nRBC: 0 % (ref 0.0–0.2)

## 2021-03-16 LAB — BASIC METABOLIC PANEL WITH GFR
Anion gap: 7 (ref 5–15)
BUN: 21 mg/dL (ref 8–23)
CO2: 26 mmol/L (ref 22–32)
Calcium: 8.9 mg/dL (ref 8.9–10.3)
Chloride: 104 mmol/L (ref 98–111)
Creatinine, Ser: 0.95 mg/dL (ref 0.44–1.00)
GFR, Estimated: >60 mL/min
Glucose, Bld: 99 mg/dL (ref 70–99)
Potassium: 4.1 mmol/L (ref 3.5–5.1)
Sodium: 137 mmol/L (ref 135–145)

## 2021-03-17 NOTE — Progress Notes (Signed)
Dr. Johnnette Litter & Dr. Dallas Schimke aware of low Hgb. OK to proceed with procedure per Dr. Johnnette Litter.

## 2021-03-17 NOTE — Final Progress Note (Signed)
Pt called & stated that she saw her PCP & that he felt it was best to cancel procedure d/t bloodwork. Dr. Dallas Schimke & Dr. Johnnette Litter in agreement. Instructed pt to reschedule procedure with Dr. Dallas Schimke office.

## 2021-03-18 ENCOUNTER — Ambulatory Visit (HOSPITAL_COMMUNITY): Admission: RE | Admit: 2021-03-18 | Payer: Medicare HMO | Source: Home / Self Care | Admitting: Orthopedic Surgery

## 2021-03-18 ENCOUNTER — Encounter (HOSPITAL_COMMUNITY): Admission: RE | Payer: Self-pay | Source: Home / Self Care

## 2021-03-18 SURGERY — REMOVAL GANGLION OF WRIST
Anesthesia: Monitor Anesthesia Care | Site: Wrist | Laterality: Right

## 2021-03-25 NOTE — Progress Notes (Signed)
Cardiology Office Note  Date: 03/26/2021   ID: Victoria, Adkins Oct 19, 1950, MRN [id number]  PCP:  Victoria Specking, MD  Cardiologist:  Victoria Dell, MD Electrophysiologist:  None   Chief Complaint: 36-month follow-up  History of Present Illness: Victoria Adkins is a 70 y.o. female with a history of COPD, dyspnea on exertion, hyperlipidemia, arthritis, anxiety, depression.  She was last seen by Dr. Diona Adkins 08/28/2019 for evaluation of chest discomfort and shortness of breath.  She reported chronic Foley being short of breath with activity such as walking with NYHA class III symptoms.  This been going on for few years.  She was having intermittent episodes of chest tightness.  She had been seen by pulmonology Victoria Adkins in October 2019.  Prior history of tobacco abuse with a diagnosis of gold 3 COPD.  She was currently not on any metered-dose inhalers.  She reported intermittent wheezing.  Echocardiogram at [facility] internal medicine with mild LVH, EF 60 to 65%.  No WMA's.  Diastolic dysfunction, normal RV contraction.  Normal biatrial chamber size, mild sclerotic aortic valve without stenosis.  Mild tricuspid regurgitation with RVSP estimated at 21 to 30 mmHg.  No pericardial effusion.  Previous Victoria Adkins [facility] ER November 2017 no diagnostic ST changes.  Perfusion images normal.  No reversible defects to suggest ischemia.  EF 55% determined to be low risk.  Continue risk of CVD approximately 30% she had recently stopped simvastatin and was currently not on aspirin.  It was recommended she start aspirin 81 mg daily and resume prior statin therapy.  Dr. Diona Adkins mentioned she could have microvascular dysfunction contributing to symptoms.  Plan was to arrange office follow-up and discuss whether would ultimately need to consider invasive cardiac testing depending on how she was doing.  He also recommended follow-up PFTs.  Her blood pressure was elevated without baseline history of hypertension.   She was encouraged to keep follow-up with PCP.  Patient recently received blood transfusion for symptomatic anemia at [facility] day hospital 2 units PRBCs for initial anemia with Hgb 6.7 and Hct 25.6. She was referred on 03/25/2021 via phone to Dr Victoria Adkins general surgery for appt on 03/30/2021 for colonoscopy for symptomatic anemia.  Patient is here today for follow-up.  She states she is feeling a little better since she received 2 units of packed red blood cells.  Her follow-up CBC on 03/23/2021 showed increase of hemoglobin to 10.1 and hematocrit to 35..  She states today she had another set of labs drawn from PCP to recheck but results were not available yet.  She states she has no source of bleeding that she is aware.  She denies any black tarry stools, BRBPR, hematuria or vomiting coffee ground emesis.  She states she had been feeling significantly fatigued since going to [place] last year in September and fatigue had been progressing.  She had some screening lab work for pending surgery which found the significant anemia.  She reports DOE and chest tightness when performing more than normal ADLs.  States she feels still feels tired and fatigued.  She denies any renal disease or history of anemia in the past.  She does have a history of smoking but states she only smoked around 15 years socially but stopped a long time ago.  She states all of her NSAIDs were stopped including meloxicam, Aleve, aspirin.  She currently takes albuterol inhaler and Trelegy Ellipta inhaler for COPD Gold III.  She states she has been told  she has a cardiac murmur.  Past Medical History:  Diagnosis Date   Anxiety    Arthritis    COPD (chronic obstructive pulmonary disease) (HCC)    Depression    Dyspnea    Hyperlipidemia     Past Surgical History:  Procedure Laterality Date   CATARACT EXTRACTION W/PHACO Left 04/20/2018   Procedure: CATARACT EXTRACTION PHACO AND INTRAOCULAR LENS PLACEMENT (IOC);  Surgeon: Victoria Pierce, MD;  Location: [facility];  Service: Ophthalmology;  Laterality: Left;  CDE: 4.71   CATARACT EXTRACTION W/PHACO Right 05/04/2018   Procedure: CATARACT EXTRACTION PHACO AND INTRAOCULAR LENS PLACEMENT (IOC);  Surgeon: Victoria Pierce, MD;  Location: [facility];  Service: Ophthalmology;  Laterality: Right;  CDE: 5.56   CORRECTION HAMMER TOE Bilateral    REPLACEMENT TOTAL KNEE Right 2011   VAGINAL HYSTERECTOMY      Current Outpatient Medications  Medication Sig Dispense Refill   albuterol (VENTOLIN HFA) 108 (90 Base) MCG/ACT inhaler Inhale 1-2 puffs into the lungs every 6 (six) hours as needed for shortness of breath or wheezing.     Fluticasone-Umeclidin-Vilant (TRELEGY ELLIPTA) 100-62.5-25 MCG/INH AEPB Inhale 1 puff into the lungs in the morning.     latanoprost (XALATAN) 0.005 % ophthalmic solution 1 drop at bedtime.     sertraline (ZOLOFT) 100 MG tablet Take 2 tablets (200 mg total) by mouth daily. 60 tablet 0   simvastatin (ZOCOR) 40 MG tablet Take 40 mg by mouth daily.     temazepam (RESTORIL) 30 MG capsule Take 30 mg by mouth at bedtime.     vitamin B-12 (CYANOCOBALAMIN) 1000 MCG tablet Take 1,000 mcg by mouth daily.     No current facility-administered medications for this visit.   Allergies:  Patient has no known allergies.   Social History: The patient  reports that she quit smoking about 13 years ago. Her smoking use included cigarettes. She has a 20.00 pack-year smoking history. She has never used smokeless tobacco. She reports current alcohol use. She reports that she does not use drugs.   Family History: The patient's family history includes Alcohol abuse in her maternal grandfather; Blindness in her sister; Brain cancer in her mother; Dementia in her father; Emphysema in her father; Seizures in her sister.   ROS:  Please see the history of present illness. Otherwise, complete review of systems is positive for none.  All other systems are reviewed and negative.   Physical  Exam: VS:  BP 140/74   Pulse 82   Ht 5\' 6"  (1.676 m)   Wt 219 lb (99.3 kg)   SpO2 97%   BMI 35.35 kg/m , BMI Body mass index is 35.35 kg/m.  Wt Readings from Last 3 Encounters:  03/26/21 219 lb (99.3 kg)  03/16/21 219 lb (99.3 kg)  03/02/21 219 lb (99.3 kg)    General: Patient appears comfortable at rest. Neck: Supple, no elevated JVP or carotid bruits, no thyromegaly. Lungs: Clear to auscultation, nonlabored breathing at rest. Cardiac: Regular rate and rhythm, no S3 or significant systolic murmur, no pericardial rub. Extremities: No pitting edema, distal pulses 2+. Skin: Warm and dry. Musculoskeletal: No kyphosis. Neuropsychiatric: Alert and oriented x3, affect grossly appropriate.  ECG:    Recent Labwork: 03/16/2021: BUN 21; Creatinine, Ser 0.95; Hemoglobin 6.9; Platelets 165; Potassium 4.1; Sodium 137  No results found for: CHOL, TRIG, HDL, CHOLHDL, VLDL, LDLCALC, LDLDIRECT  Other Studies Reviewed Today:   Assessment and Plan:  1. Dyspnea on exertion   2. Mixed hyperlipidemia  3. Tobacco abuse, in remission   4. Chronic obstructive pulmonary disease, unspecified COPD type (HCC)   5. Anemia, unspecified type   6. Fatigue, unspecified type    1. Dyspnea on exertion Complaining of dyspnea on exertion.  Recently was discovered to have significant anemia with initial hemoglobin of 6.7 and hematocrit of 25.6.  She received 2 units of packed red blood cells with increase of hemoglobin to 10.1 and hematocrit of 35 on lab work dated 03/23/2021.  She has an upcoming colonoscopy she continues with dyspnea on exertion with a cardiac murmur.  Please get an echocardiogram to evaluate LV function, diastolic function, valvular function.  2. Mixed hyperlipidemia Currently on simvastatin 40 mg daily.  3. Tobacco abuse, in remission Has history of smoking approximately 15-20 years socially per her statement.  She stopped in 2013.  4. Chronic obstructive pulmonary disease, unspecified  COPD type (HCC) She has COPD Gold stage III.  She has seen pulmonology in the past Victoria Adkins.  She has known metered-dose inhalers.  She has some DOE.  She stated had become worse before saving her packed red blood cells likely due to significant anemia.  She states it is a little bit better now but still has shortness of breath with exertion.  5. Anemia, unspecified type Recent discovery of significant anemia when having screening lab work for pending surgery.  Initial hemoglobin was 6.7 and hematocrit was 25.6.  She received 2 PRBC transfusions with improvement of hemoglobin to 10.1 and hematocrit of 35.  She had lab work drawn today to recheck at PCP office.  Results are pending.  Her most recent lab work showed iron level of 95.  She was supposed to have total iron-binding capacity and ferritin levels drawn.  I do not see that in her lab work.  We will order TIBC and ferritin levels as well as folate level and B12.  She is currently taking vitamin B12 1000 mcg tablets daily.  Of note she has no evidence of bleeding source per her statement.  She denies any hematuria, denies any black tarry stools, BRBPR, no vomiting or coffee-ground emesis.  Denies any renal dysfunction.  Denies any history of anemia in the past.  She states she has an upcoming colonoscopy with Dr. Marcha Solders, general surgeon [facility].  6.  Fatigue Continues to complain of fatigue.  Please get thyroid panel including TSH, T3, T4, vitamin B12, vitamin D3, BMET.  She has an upcoming follow-up CBC which was drawn today and results are pending to recheck anemia,   Medication Adjustments/Labs and Tests Ordered: Current medicines are reviewed at length with the patient today.  Concerns regarding medicines are outlined above.   Disposition: Follow-up with Dr. Diona Adkins or [facility] 6 to 8 weeks  Signed, Rennis Harding, NP 03/26/2021 3:43 PM    [facility] at [facility] [address] [place], [place], [place] [address] Phone: [phone]; Fax: [phone]

## 2021-03-26 ENCOUNTER — Encounter: Payer: Self-pay | Admitting: Family Medicine

## 2021-03-26 ENCOUNTER — Ambulatory Visit: Payer: Medicare HMO | Admitting: Family Medicine

## 2021-03-26 VITALS — BP 140/74 | HR 82 | Ht 66.0 in | Wt 219.0 lb

## 2021-03-26 DIAGNOSIS — E782 Mixed hyperlipidemia: Secondary | ICD-10-CM

## 2021-03-26 DIAGNOSIS — F17201 Nicotine dependence, unspecified, in remission: Secondary | ICD-10-CM

## 2021-03-26 DIAGNOSIS — D649 Anemia, unspecified: Secondary | ICD-10-CM

## 2021-03-26 DIAGNOSIS — R0609 Other forms of dyspnea: Secondary | ICD-10-CM

## 2021-03-26 DIAGNOSIS — R0600 Dyspnea, unspecified: Secondary | ICD-10-CM

## 2021-03-26 DIAGNOSIS — R5383 Other fatigue: Secondary | ICD-10-CM | POA: Diagnosis not present

## 2021-03-26 DIAGNOSIS — J449 Chronic obstructive pulmonary disease, unspecified: Secondary | ICD-10-CM | POA: Diagnosis not present

## 2021-03-26 NOTE — Patient Instructions (Signed)
Medication Instructions:  Your physician recommends that you continue on your current medications as directed. Please refer to the Current Medication list given to you today.  *If you need a refill on your cardiac medications before your next appointment, please call your pharmacy*   Lab Work: TIBC Ferritin Folate BMET Thyroid B12/D3  If you have labs (blood work) drawn today and your tests are completely normal, you will receive your results only by: MyChart Message (if you have MyChart) OR A paper copy in the mail If you have any lab test that is abnormal or we need to change your treatment, we will call you to review the results.   Testing/Procedures: Your physician has requested that you have an echocardiogram. Echocardiography is a painless test that uses sound waves to create images of your heart. It provides your doctor with information about the size and shape of your heart and how well your heart's chambers and valves are working. This procedure takes approximately one hour. There are no restrictions for this procedure.    Follow-Up: At [facility], you and your health needs are our priority.  As part of our continuing mission to provide you with exceptional heart care, we have created designated Provider Care Teams.  These Care Teams include your primary Cardiologist (physician) and Advanced Practice Providers (APPs -  Physician Assistants and Nurse Practitioners) who all work together to provide you with the care you need, when you need it.  We recommend signing up for the patient portal called "MyChart".  Sign up information is provided on this After Visit Summary.  MyChart is used to connect with patients for Virtual Visits (Telemedicine).  Patients are able to view lab/test results, encounter notes, upcoming appointments, etc.  Non-urgent messages can be sent to your provider as well.   To learn more about what you can do with MyChart, go to [email or web address].     Your next appointment:   6-8 week(s)  The format for your next appointment:   In Person  Provider:   Nena Polio, NP   Other Instructions

## 2021-03-31 ENCOUNTER — Ambulatory Visit (INDEPENDENT_AMBULATORY_CARE_PROVIDER_SITE_OTHER): Payer: Medicare HMO

## 2021-03-31 DIAGNOSIS — R0609 Other forms of dyspnea: Secondary | ICD-10-CM

## 2021-03-31 DIAGNOSIS — R0600 Dyspnea, unspecified: Secondary | ICD-10-CM

## 2021-03-31 LAB — ECHOCARDIOGRAM COMPLETE
AR max vel: 1.75 cm2
AV Area VTI: 1.69 cm2
AV Area mean vel: 1.73 cm2
AV Mean grad: 10 mmHg
AV Peak grad: 16.9 mmHg
AV Vena cont: 0.33 cm
Ao pk vel: 2.06 m/s
Area-P 1/2: 2.62 cm2
Calc EF: 56 %
MV M vel: 2.34 m/s
MV Peak grad: 22 mmHg
P 1/2 time: 566 ms
S' Lateral: 4.04 cm
Single Plane A2C EF: 56.3 %
Single Plane A4C EF: 55.6 %

## 2021-04-02 ENCOUNTER — Telehealth: Payer: Self-pay | Admitting: Family Medicine

## 2021-04-02 ENCOUNTER — Encounter: Payer: Medicare HMO | Admitting: Orthopedic Surgery

## 2021-04-02 ENCOUNTER — Telehealth: Payer: Self-pay | Admitting: *Deleted

## 2021-04-02 MED ORDER — BLOOD PRESSURE MONITOR KIT: 1.0000 | PACK | 0 refills | Status: AC

## 2021-04-02 NOTE — Telephone Encounter (Signed)
-----   Message from Netta Neat., NP sent at 03/31/2021  6:03 PM EDT ----- Please call the patient and let her know the echocardiogram showed she has good pumping function of the heart. The main pumping chamber is a little stiff. This could possibly contribute to some shortness of breath. She has a very trivial leak in the mitral valve on the left and mild leak in aortic valve. Best management is to keep BP consistently at 130/80 or below and manage all other risk factors.   Netta Neat, NP  03/31/2021 5:54 PM

## 2021-04-02 NOTE — Telephone Encounter (Signed)
sent 

## 2021-04-02 NOTE — Telephone Encounter (Signed)
Patient informed. Copy sent to PCP °

## 2021-04-02 NOTE — Telephone Encounter (Signed)
-----   Message from Andrew L Quinn Jr., NP sent at 03/31/2021  6:03 PM EDT ----- Please call the patient and let her know the echocardiogram showed she has good pumping function of the heart. The main pumping chamber is a little stiff. This could possibly contribute to some shortness of breath. She has a very trivial leak in the mitral valve on the left and mild leak in aortic valve. Best management is to keep BP consistently at 130/80 or below and manage all other risk factors.   ANDREW L QUINN JR, NP  03/31/2021 5:54 PM  

## 2021-04-02 NOTE — Telephone Encounter (Signed)
Calling for echo results °

## 2021-04-02 NOTE — Telephone Encounter (Signed)
-----   Message from Netta Neat., NP sent at 03/31/2021  3:49 PM EDT ----- Please call the patient let her know all the iron studies looked good except iron saturation which was only slightly lower than normal. Vitamin D an B12 as well as folate levels look good  Netta Neat, NP  03/31/2021 3:48 PM

## 2021-04-02 NOTE — Telephone Encounter (Signed)
New message    Please call in a Blood pressure cuff to [facility] for the patient , and her insurance will cover it there.

## 2021-04-05 ENCOUNTER — Telehealth: Payer: Self-pay | Admitting: Family Medicine

## 2021-04-05 NOTE — Telephone Encounter (Signed)
Advised that she needed to contact her PCP with this request since we did not manage anemia Verbalized understanding

## 2021-04-05 NOTE — Telephone Encounter (Signed)
Had Echocardiogram last week(03/31/21)because her blood count went down to 6. She had a blood transfusion it came up to a 10 but Mardelle Matte told her he wants it up to 12-15. She needs an order put In for the blood transfusion. She is requesting Mardelle Matte or Dr. Diona Browner call her #[phone].

## 2021-04-06 ENCOUNTER — Telehealth: Payer: Self-pay | Admitting: Family Medicine

## 2021-04-06 NOTE — Telephone Encounter (Signed)
Echo results reviewed in detail with patient again.

## 2021-04-06 NOTE — Telephone Encounter (Signed)
Patient called stating that she needs to speak with someone again about her recent echo findings.

## 2021-05-14 ENCOUNTER — Ambulatory Visit: Payer: Medicare HMO | Admitting: Family Medicine

## 2021-10-14 ENCOUNTER — Other Ambulatory Visit: Payer: Self-pay | Admitting: Internal Medicine

## 2021-10-14 DIAGNOSIS — Z1231 Encounter for screening mammogram for malignant neoplasm of breast: Secondary | ICD-10-CM

## 2021-10-19 IMAGING — MG MM DIGITAL SCREENING BILAT W/ TOMO AND CAD
8 series · 8 of 24 positions shown · non-contrast
Comparison: Previous exam(s).

CLINICAL DATA: Screening.

EXAM:
DIGITAL SCREENING BILATERAL MAMMOGRAM WITH TOMOSYNTHESIS AND CAD
TECHNIQUE: Bilateral screening digital craniocaudal and mediolateral oblique
mammograms were obtained. Bilateral screening digital breast
tomosynthesis was performed. The images were evaluated with
computer-aided detection.

[L CC synth-2D]
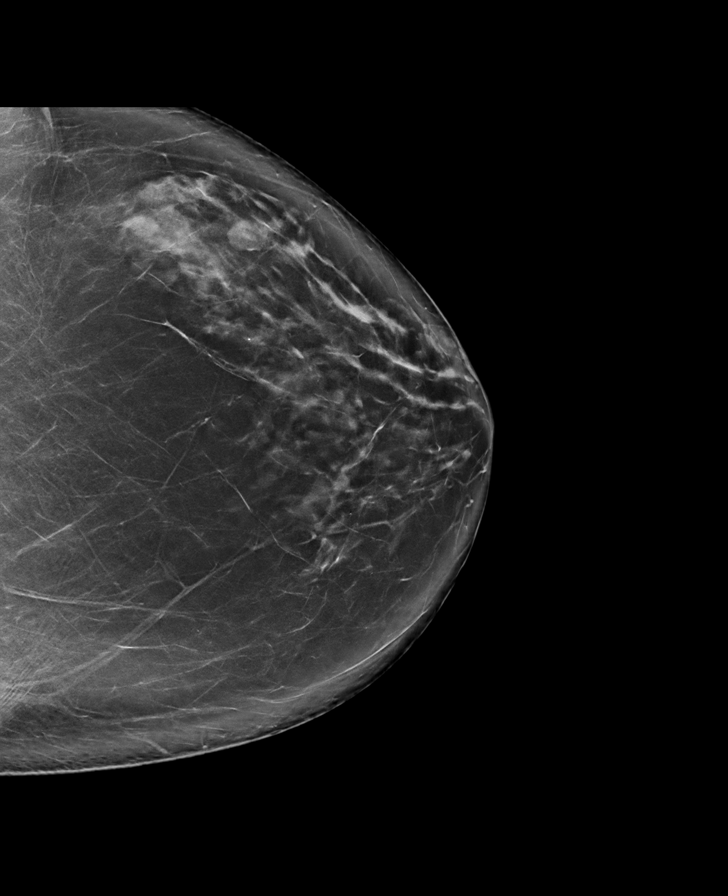

[R MLO synth-2D]
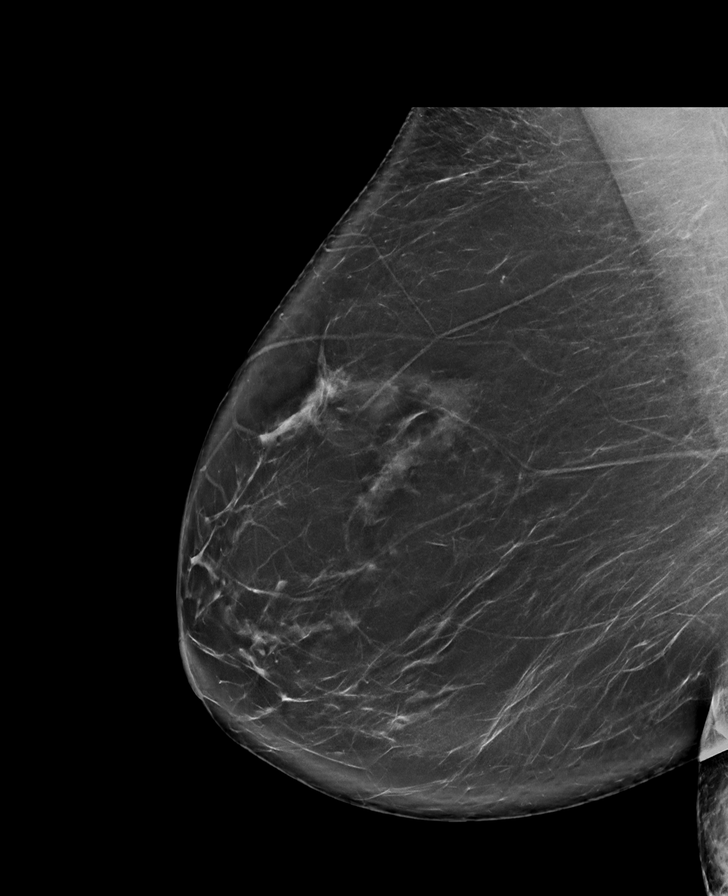

[R CC synth-2D]
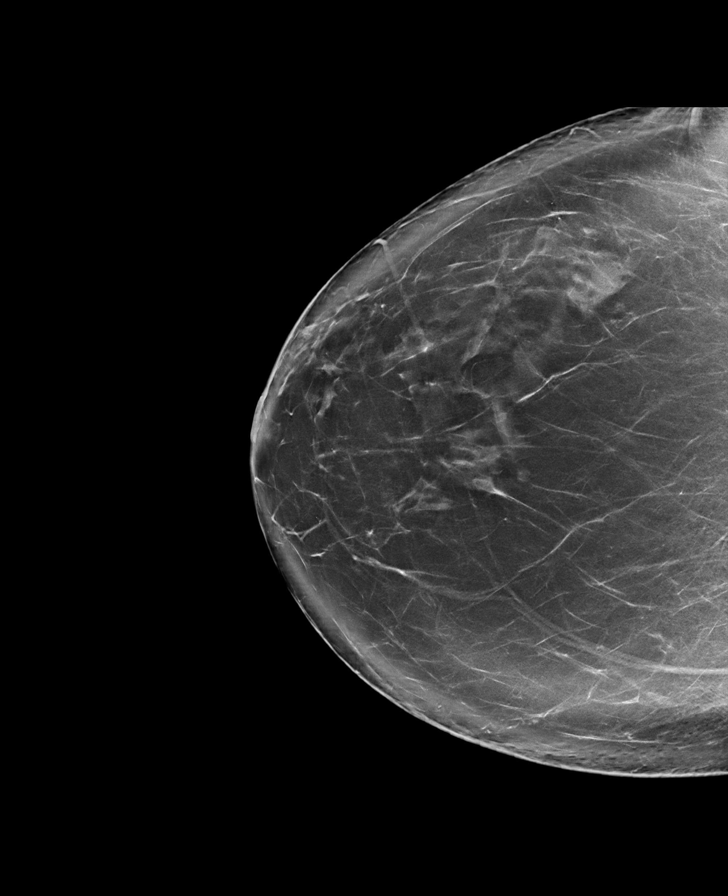

[L MLO synth-2D]
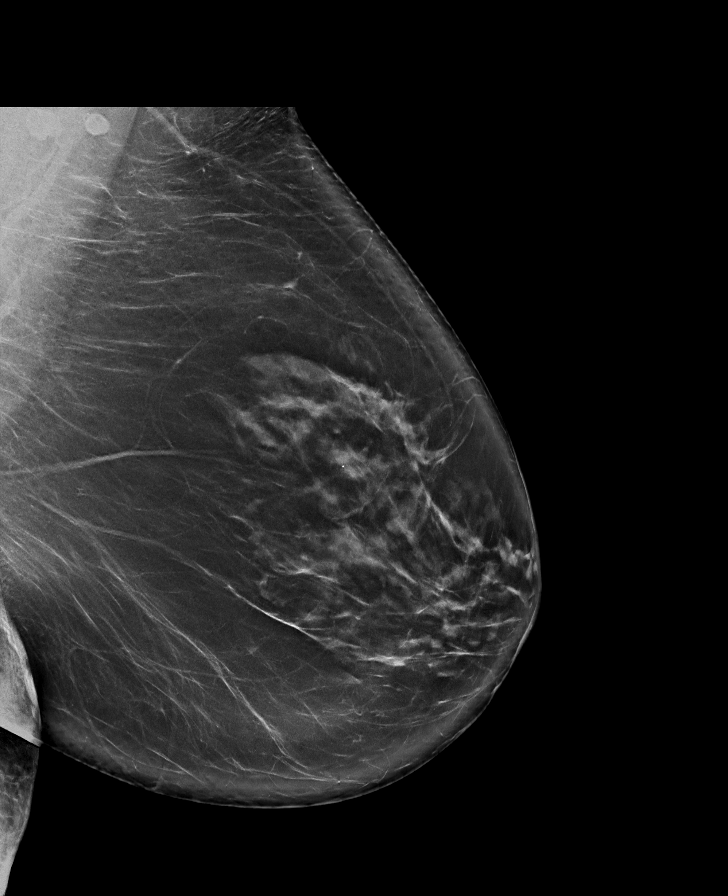

[R CC tomo · tomo slice 49/98.0]
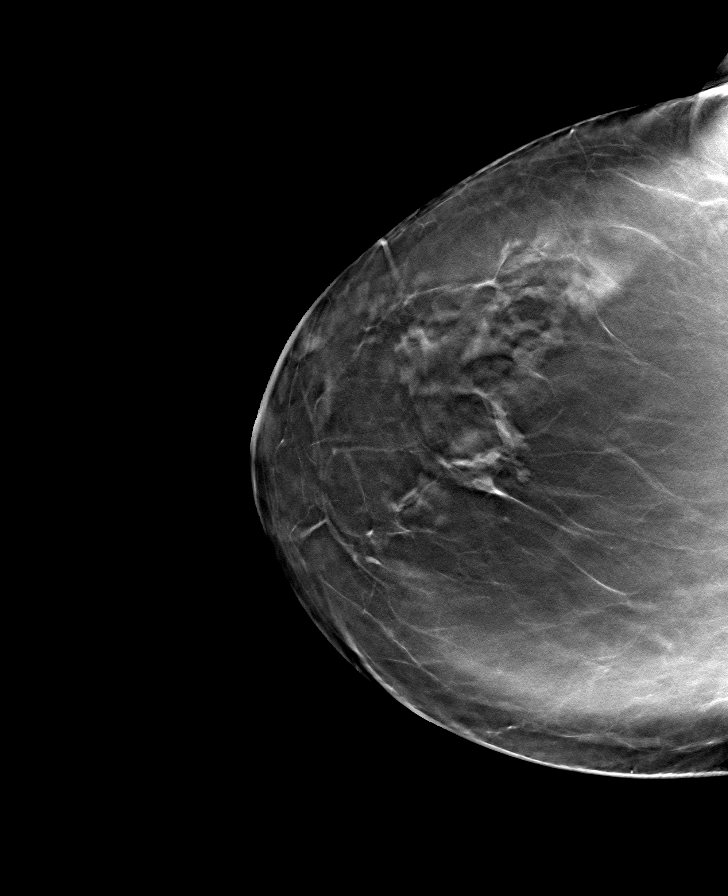

[L MLO tomo · tomo slice 47/93.0]
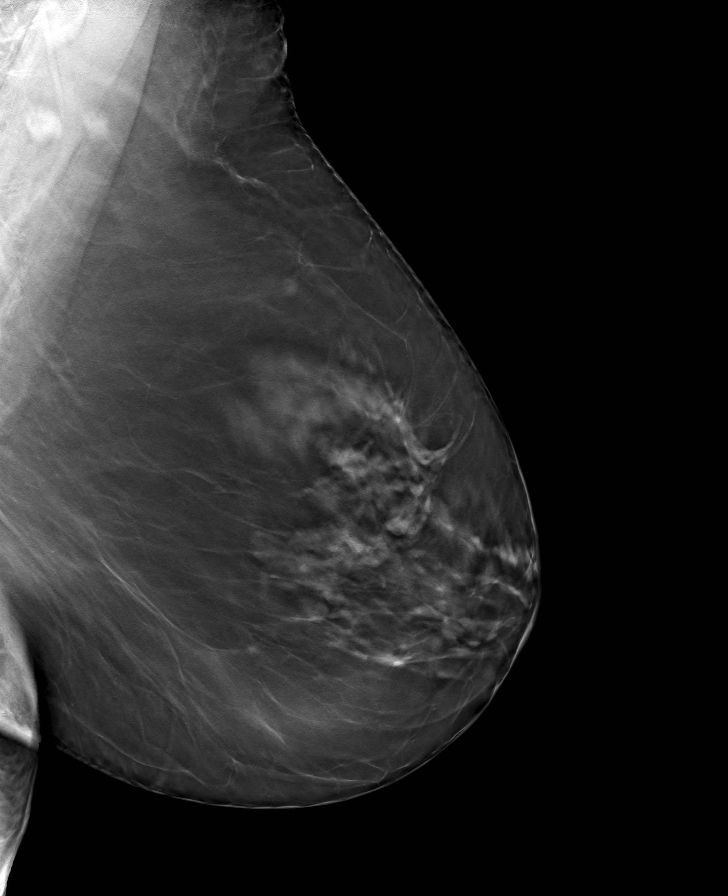

[R MLO tomo · tomo slice 47/94.0]
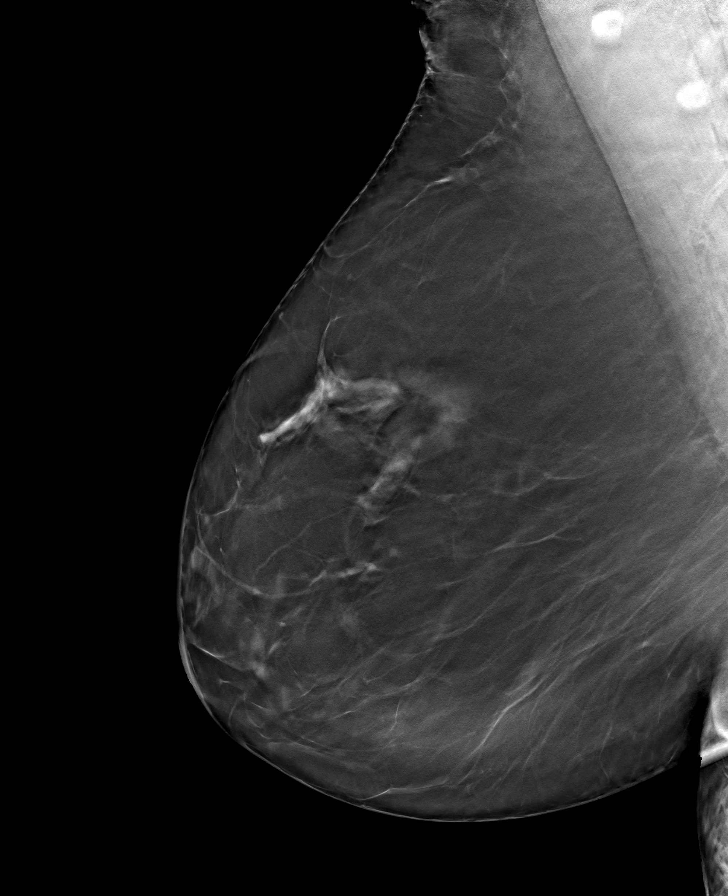

[L CC tomo · tomo slice 46/91.0]
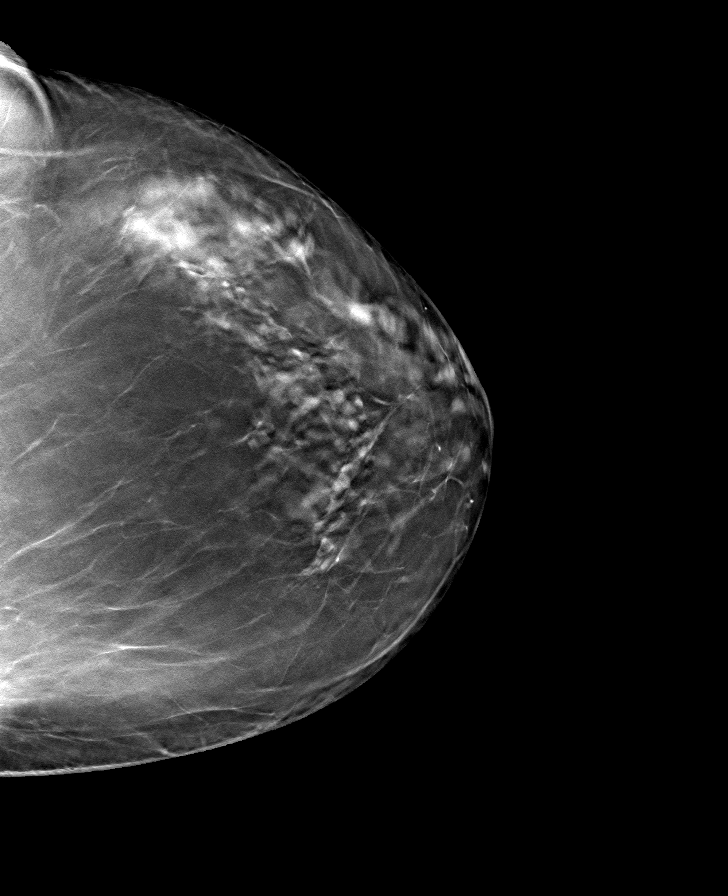

[8 of 24 positions shown; findings below may reference images not displayed]

ACR Breast Density Category c: The breast tissue is heterogeneously
dense, which may obscure small masses.
FINDINGS: There are no findings suspicious for malignancy. The images were
evaluated with computer-aided detection.
IMPRESSION: No mammographic evidence of malignancy. A result letter of this
screening mammogram will be mailed directly to the patient.

RECOMMENDATION:
Screening mammogram in one year. (Code:T4-5-GWO)

BI-RADS CATEGORY  1: Negative.

## 2022-01-26 NOTE — Progress Notes (Signed)
? ?Cardiology Office Note   ? ?Date:  02/02/2022  ? ?ID:  Victoria Adkins, DOB 1951/09/17, MRN [id number] ? ? ?PCP:  Glenda Chroman, MD ?  ?[place]  ?Cardiologist:  Rozann Lesches, MD   ?Advanced Practice Provider:  No care team member to display ?Electrophysiologist:  None  ? ?[id number]}  ? ?Chief Complaint  ?Patient presents with  ? Loss of Consciousness  ? ? ?History of Present Illness:  ?Victoria Adkins is a 71 y.o. female with history of gold 3 COPD, hypertension, HLD.  Patient saw Dr. Domenic Polite 08/2019 for chronic shortness of breath, dyspnea on exertion and intermittent chest pain more related to emotional upset.  Lexiscan Myoview low risk no obvious ischemia on echo normal LVEF with mild diastolic dysfunction and 6 mildly sclerotic aortic valve without stenosis.  He added aspirin 81 mg daily and resumed her prior statin ? ?Repeat 2D echo 03/31/2021 normal LVEF with grade 1 DD, and mild aortic valve stenosis. ? ?Patient added onto my schedule for evaluation of syncope per PCP. CT at [facility] [place] 01/19/22 small vessel disease, no acute CVA. Was walking into working became dizzy and passed out beginning of April. Was only out a few seconds. Didn't get hurt.  Had blood work 01/22/22 and platelets were 79,000, up today to 136,000. Having liver ultrasound. 4 weeks ago when walking in her door she passed out without dizziness and fell face first and broke her nose. She has 3 hiatal hernias. Last night she had chest tightness after eating and presses on her chest and it goes away. 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 Some dyspnea with activity improved with breztri inhaler. Denies palpitations.  BP has been normal. No excessive caffeine, alcohol, OTC meds.  ? ? ? ?Past Medical History:  ?Diagnosis Date  ? Anxiety   ? Arthritis   ? COPD (chronic obstructive pulmonary disease) ([place])   ? Depression   ? Dyspnea   ? Hyperlipidemia   ? ? ?Past Surgical  History:  ?Procedure Laterality Date  ? CATARACT EXTRACTION W/PHACO Left 04/20/2018  ? Procedure: CATARACT EXTRACTION PHACO AND INTRAOCULAR LENS PLACEMENT (IOC);  Surgeon: Baruch Goldmann, MD;  Location: [facility];  Service: Ophthalmology;  Laterality: Left;  CDE: 4.71  ? CATARACT EXTRACTION W/PHACO Right 05/04/2018  ? Procedure: CATARACT EXTRACTION PHACO AND INTRAOCULAR LENS PLACEMENT (IOC);  Surgeon: Baruch Goldmann, MD;  Location: [facility];  Service: Ophthalmology;  Laterality: Right;  CDE: 5.56  ? CORRECTION HAMMER TOE Bilateral   ? REPLACEMENT TOTAL KNEE Right 2011  ? VAGINAL HYSTERECTOMY    ? ? ?Current Medications: ?Current Meds  ?Medication Sig  ? Blood Pressure Monitor KIT 1 each by Does not apply route as directed. Dx: diastolic dysfunction  ? BREZTRI AEROSPHERE 160-9-4.8 MCG/ACT AERO Inhale into the lungs.  ? latanoprost (XALATAN) 0.005 % ophthalmic solution 1 drop at bedtime.  ? sertraline (ZOLOFT) 100 MG tablet Take 2 tablets (200 mg total) by mouth daily.  ? simvastatin (ZOCOR) 40 MG tablet Take 40 mg by mouth daily.  ? temazepam (RESTORIL) 30 MG capsule Take 30 mg by mouth at bedtime.  ?  ? ?Allergies:   Patient has no known allergies.  ? ?Social History  ? ?Socioeconomic History  ? Marital status: Divorced  ?  Spouse name: Not on file  ? Number of children: 1  ? Years of education: Not on file  ? Highest education level: High  school graduate  ?Occupational History  ? Not on file  ?Tobacco Use  ? Smoking status: Former  ?  Packs/day: 1.00  ?  Years: 20.00  ?  Pack years: 20.00  ?  Types: Cigarettes  ?  Quit date: 09/20/2007  ?  Years since quitting: 14.3  ? Smokeless tobacco: Never  ?Vaping Use  ? Vaping Use: Never used  ?Substance and Sexual Activity  ? Alcohol use: Yes  ?  Comment: Occasional  ? Drug use: No  ? Sexual activity: Not on file  ?Other Topics Concern  ? Not on file  ?Social History Narrative  ? Not on file  ? ?Social Determinants of Health  ? ?Financial Resource Strain: Not on file  ?Food  Insecurity: Not on file  ?Transportation Needs: Not on file  ?Physical Activity: Not on file  ?Stress: Not on file  ?Social Connections: Not on file  ?  ? ?Family History:  The patient's  family history includes Alcohol abuse in her maternal grandfather; Blindness in her sister; Brain cancer in her mother; Dementia in her father; Emphysema in her father; Seizures in her sister.  ? ?ROS:   ?Please see the history of present illness.    ?ROS All other systems reviewed and are negative. ? ? ?PHYSICAL EXAM:   ?VS:  BP 130/80   Pulse 69   Ht _0  (1.676 m)   Wt 216 lb 6.4 oz (98.2 kg)   SpO2 95%   BMI 34.93 kg/m?   ?Physical Exam  ?GEN: Obese, in no acute distress  ?Neck: no JVD, carotid bruits, or masses ?Cardiac:RRR; 2/6 systolic murmur LSB  ?Respiratory:  clear to auscultation bilaterally, normal work of breathing ?GI: soft, nontender, nondistended, + BS ?Ext: without cyanosis, clubbing, or edema,  ?Neuro:  Alert and Oriented x 3,  ?Psych: euthymic mood, full affect ? ?Wt Readings from Last 3 Encounters:  ?02/02/22 216 lb 6.4 oz (98.2 kg)  ?03/26/21 219 lb (99.3 kg)  ?03/16/21 219 lb (99.3 kg)  ?  ? ? ?Studies/Labs Reviewed:  ? ?EKG:  EKG is  ordered today.  The ekg ordered today demonstrates  NSR normal EKG ? ?Recent Labs: ?03/16/2021: BUN 21; Creatinine, Ser 0.95; Hemoglobin 6.9; Platelets 165; Potassium 4.1; Sodium 137  ? ?Lipid Panel ?No results found for: CHOL, TRIG, HDL, CHOLHDL, VLDL, LDLCALC, LDLDIRECT ? ?Additional studies/ records that were reviewed today include:  ? ?CT head 01/19/22 ?FINDINGS: ?Brain: There is no evidence of an acute infarct, intracranial ?hemorrhage, mass, midline shift, or extra-axial fluid collection. 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 There is mild cerebral atrophy. ? ?Vascular: Calcified atherosclerosis at the skull base. No hyperdense ?vessel. ? ?Skull: No fracture or  suspicious osseous lesion. ? ?Sinuses/Orbits: Visualized paranasal sinuses and mastoid air cells ?are clear. Bilateral cataract extraction. ? ?Other: None. ?   ? ? ? ?2D echo 03/31/2021 ?IMPRESSIONS  ? ? ? 1. Left ventricular ejection fraction, by estimation, is 55 to 60%. The  ?left ventricle has normal function. The left ventricle has no regional  ?wall motion abnormalities. The left ventricular internal cavity size was  ?mildly dilated. Left ventricular  ?diastolic parameters are consistent with Grade I diastolic dysfunction  ?(impaired relaxation). Normal global longitudinal strain of -17.7%.  ? 2. Right ventricular systolic function is normal. The right ventricular  ?size is normal. Tricuspid regurgitation signal is inadequate for assessing  ?PA pressure.  ? 3. The mitral  valve is grossly normal. Trivial mitral valve  ?regurgitation.  ? 4. The aortic valve is tricuspid. There is mild calcification of the  ?aortic valve. Aortic valve regurgitation is mild. Mild aortic valve  ?stenosis. Aortic regurgitation PHT measures 566 msec. Aortic valve mean  ?gradient measures 10.0 mmHg.  ? 5. The inferior vena cava is normal in size with greater than 50%  ?respiratory variability, suggesting right atrial pressure of 3 mmHg.  ? ?Comparison(s): Echocardiogram done 07/29/19 with [facility] showed an EF of  ?60-65%,.  ?Echo 2020 ?Echocardiogram done at [facility] reported mild LVH with LVEF 60 to 65%, no wall motion abnormalities, diastolic dysfunction, normal RV contraction, normal biatrial chamber size, mildly sclerotic aortic valve without stenosis, mild tricuspid regurgitation with RVSP estimated 20 to 30 mmHg, no pericardial effusion  ? ?Leane Call at [facility] on August 06, 2019.  There were no diagnostic ST segment changes.  Perfusion imaging was normal, no reversible defects to suggest ischemia and LVEF 55%, low risk findings.  We discussed this today. ?  ?Risk Assessment/Calculations:   ?   ? ? ? ? ?ASSESSMENT:   ? ?1. Chest pain, unspecified type   ?2. Dyspnea on exertion   ?3. Chronic obstructive pulmonary disease, unspecified COPD type ([place])   ?4. Essential hypertension   ?5. Other hyperlipidemia   ?6. Aortic

## 2022-02-02 ENCOUNTER — Encounter: Payer: Self-pay | Admitting: *Deleted

## 2022-02-02 ENCOUNTER — Encounter: Payer: Self-pay | Admitting: Physician Assistant

## 2022-02-02 ENCOUNTER — Ambulatory Visit: Payer: Medicare HMO | Admitting: Physician Assistant

## 2022-02-02 ENCOUNTER — Ambulatory Visit (INDEPENDENT_AMBULATORY_CARE_PROVIDER_SITE_OTHER): Payer: Medicare HMO

## 2022-02-02 VITALS — BP 130/80 | HR 69 | Ht 66.0 in | Wt 216.4 lb

## 2022-02-02 DIAGNOSIS — J449 Chronic obstructive pulmonary disease, unspecified: Secondary | ICD-10-CM

## 2022-02-02 DIAGNOSIS — R55 Syncope and collapse: Secondary | ICD-10-CM

## 2022-02-02 DIAGNOSIS — I10 Essential (primary) hypertension: Secondary | ICD-10-CM

## 2022-02-02 DIAGNOSIS — E7849 Other hyperlipidemia: Secondary | ICD-10-CM

## 2022-02-02 DIAGNOSIS — I350 Nonrheumatic aortic (valve) stenosis: Secondary | ICD-10-CM | POA: Diagnosis not present

## 2022-02-02 DIAGNOSIS — R0609 Other forms of dyspnea: Secondary | ICD-10-CM | POA: Diagnosis not present

## 2022-02-02 DIAGNOSIS — R079 Chest pain, unspecified: Secondary | ICD-10-CM | POA: Diagnosis not present

## 2022-02-02 NOTE — Patient Instructions (Signed)
Medication Instructions:  ?Your physician recommends that you continue on your current medications as directed. Please refer to the Current Medication list given to you today. ? ?*If you need a refill on your cardiac medications before your next appointment, please call your pharmacy* ? ? ?Lab Work: ?Your physician recommends that you return for lab work when you come for your stress test. (TSH, Lipid, A1C)  ? ?If you have labs (blood work) drawn today and your tests are completely normal, you will receive your results only by: ?MyChart Message (if you have MyChart) OR ?A paper copy in the mail ?If you have any lab test that is abnormal or we need to change your treatment, we will call you to review the results. ? ? ?Testing/Procedures: ?Your physician has requested that you have a lexiscan myoview. For further information please visit [email or web address] Please follow instruction sheet, as given. ? ?Your physician has requested that you have an echocardiogram. Echocardiography is a painless test that uses sound waves to create images of your heart. It provides your doctor with information about the size and shape of your heart and how well your heart?s chambers and valves are working. This procedure takes approximately one hour. There are no restrictions for this procedure. ?  ?ZIO XT- Long Term Monitor Instructions  ? ?Your physician has requested you wear your ZIO patch monitor__14___days.  ?You may remove it on Feb 16, 2022 ? ?This is a single patch monitor.  [employer] supplies one patch monitor per enrollment.  Additional stickers are not available. ?  ?Please do not apply patch if you will be having a Nuclear Stress Test, Echocardiogram, Cardiac CT, MRI, or Chest Xray during the time frame you would be wearing the monitor. The patch cannot be worn during these tests.  You cannot remove and re-apply the ZIO XT patch monitor. ?  ?Your ZIO patch monitor will be sent USPS Priority mail from [facility]  directly to your home address. The monitor may also be mailed to a PO BOX if home delivery is not available.   It may take 3-5 days to receive your monitor after you have been enrolled. ?  ?Once you have received you monitor, please review enclosed instructions.  Your monitor has already been registered assigning a specific monitor serial # to you. ?  ?Applying the monitor  ? ?Shave hair from upper left chest. ?  ?Hold abrader disc by orange tab.  Rub abrader in 40 strokes over left upper chest as indicated in your monitor instructions. ?  ?Clean area with 4 enclosed alcohol pads .  Use all pads to assure are is cleaned thoroughly.  Let dry.  ? ?Apply patch as indicated in monitor instructions.  Patch will be place under collarbone on left side of chest with arrow pointing upward. ?  ?Rub patch adhesive wings for 2 minutes.Remove white label marked "1".  Remove white label marked "2".  Rub patch adhesive wings for 2 additional minutes. ?  ?While looking in a mirror, press and release button in center of patch.  A small green light will flash 3-4 times .  This will be your only indicator the monitor has been turned on. ?    ?Do not shower for the first 24 hours.  You may shower after the first 24 hours. ?  ?Press button if you feel a symptom. You will hear a small click.  Record Date, Time and Symptom in the Patient Log Book. ?  ?When you are ready  to remove patch, follow instructions on last 2 pages of Patient Log Book.  Stick patch monitor onto last page of Patient Log Book. ?  ?Place Patient Log Book in [facility] box.  Use locking tab on box and tape box closed securely.  The [employer] has [employer] on it.  Please place in mailbox as soon as possible.  Your physician should have your test results approximately 7 days after the monitor has been mailed back to [facility]. ?  ?Call [facility] at [phone] if you have questions regarding your ZIO XT patch monitor.  Call them  immediately if you see an orange light blinking on your monitor. ?  ?If your monitor falls off in less than 4 days contact our Monitor department at [phone].  If your monitor becomes loose or falls off after 4 days call [employer] at [phone] for suggestions on securing your monitor.  ? ? ? ?Follow-Up: ?At [facility], you and your health needs are our priority.  As part of our continuing mission to provide you with exceptional heart care, we have created designated Provider Care Teams.  These Care Teams include your primary Cardiologist (physician) and Advanced Practice Providers (APPs -  Physician Assistants and Nurse Practitioners) who all work together to provide you with the care you need, when you need it. ? ?We recommend signing up for the patient portal called "MyChart".  Sign up information is provided on this After Visit Summary.  MyChart is used to connect with patients for Virtual Visits (Telemedicine).  Patients are able to view lab/test results, encounter notes, upcoming appointments, etc.  Non-urgent messages can be sent to your provider as well.   ?To learn more about what you can do with MyChart, go to [email or web address].   ? ?Your next appointment:   ?2 month(s) ? ?The format for your next appointment:   ?In Person ? ?Provider:   ?Nona Dell, MD  ? ? ?Other Instructions ?Thank you for choosing [facility]! ?  ? ?Important Information About [employer] ? ? ? ? ? ? ?

## 2022-02-07 ENCOUNTER — Telehealth: Payer: Self-pay | Admitting: Cardiology

## 2022-02-07 ENCOUNTER — Ambulatory Visit: Payer: Medicare HMO

## 2022-02-07 NOTE — Telephone Encounter (Signed)
Patient was calling in to see if she can use surgical tape on her monitor. Please advise

## 2022-02-07 NOTE — Telephone Encounter (Signed)
Patient states that she tried to contact zio customer service and left a message but has yet to get a response. States that she did not put tape on the actual monitor but on the tape that holds the monitor on. States that she will discuss with someone once she gets a call back.

## 2022-02-15 ENCOUNTER — Encounter (HOSPITAL_COMMUNITY)
Admission: RE | Admit: 2022-02-15 | Discharge: 2022-02-15 | Disposition: A | Discharge status: Home / Self Care | Payer: Medicare HMO | Source: Ambulatory Visit | Attending: Physician Assistant | Admitting: Physician Assistant

## 2022-02-15 ENCOUNTER — Ambulatory Visit (HOSPITAL_COMMUNITY)
Admission: RE | Admit: 2022-02-15 | Discharge: 2022-02-15 | Disposition: A | Discharge status: Home / Self Care | Payer: Medicare HMO | Source: Ambulatory Visit | Attending: Internal Medicine | Admitting: Internal Medicine

## 2022-02-15 ENCOUNTER — Other Ambulatory Visit: Payer: Self-pay

## 2022-02-15 ENCOUNTER — Other Ambulatory Visit (HOSPITAL_COMMUNITY)
Admission: RE | Admit: 2022-02-15 | Discharge: 2022-02-15 | Disposition: A | Discharge status: Home / Self Care | Payer: Medicare HMO | Source: Ambulatory Visit | Attending: Physician Assistant | Admitting: Physician Assistant

## 2022-02-15 DIAGNOSIS — J449 Chronic obstructive pulmonary disease, unspecified: Secondary | ICD-10-CM | POA: Insufficient documentation

## 2022-02-15 DIAGNOSIS — I350 Nonrheumatic aortic (valve) stenosis: Secondary | ICD-10-CM | POA: Insufficient documentation

## 2022-02-15 DIAGNOSIS — R079 Chest pain, unspecified: Secondary | ICD-10-CM

## 2022-02-15 DIAGNOSIS — R0609 Other forms of dyspnea: Secondary | ICD-10-CM | POA: Diagnosis present

## 2022-02-15 DIAGNOSIS — E7849 Other hyperlipidemia: Secondary | ICD-10-CM | POA: Diagnosis not present

## 2022-02-15 DIAGNOSIS — I10 Essential (primary) hypertension: Secondary | ICD-10-CM | POA: Insufficient documentation

## 2022-02-15 DIAGNOSIS — R55 Syncope and collapse: Secondary | ICD-10-CM | POA: Insufficient documentation

## 2022-02-15 LAB — NM MYOCAR MULTI W/SPECT W/WALL MOTION / EF
LV dias vol: 104 mL (ref 46–106)
LV sys vol: 61 mL
Nuc Stress EF: 42 %
Peak HR: 83 {beats}/min
RATE: 0.5
Rest HR: 64 {beats}/min
Rest Nuclear Isotope Dose: 10.9 mCi
SDS: 3
SRS: 0
SSS: 3
ST Depression (mm): 0 mm
Stress Nuclear Isotope Dose: 30 mCi
TID: 1

## 2022-02-15 LAB — LIPID PANEL
Cholesterol: 169 mg/dL (ref 0–200)
HDL: 53 mg/dL
LDL Cholesterol: 88 mg/dL (ref 0–99)
Total CHOL/HDL Ratio: 3.2 ratio
Triglycerides: 142 mg/dL
VLDL: 28 mg/dL (ref 0–40)

## 2022-02-15 LAB — HEMOGLOBIN A1C
Hgb A1c MFr Bld: 5.5 % (ref 4.8–5.6)
Mean Plasma Glucose: 111.15 mg/dL

## 2022-02-15 MED ORDER — TECHNETIUM TC 99M TETROFOSMIN IV KIT
10.0000 | PACK | Freq: Once | INTRAVENOUS | Status: AC | PRN
  Administered 2022-02-15: 10.9 via INTRAVENOUS

## 2022-02-15 MED ORDER — TECHNETIUM TC 99M TETROFOSMIN IV KIT
30.0000 | PACK | Freq: Once | INTRAVENOUS | Status: AC | PRN
  Administered 2022-02-15: 30 via INTRAVENOUS

## 2022-02-15 MED ORDER — REGADENOSON 0.4 MG/5ML IV SOLN
INTRAVENOUS | Status: AC
  Administered 2022-02-15: 0.4 mg via INTRAVENOUS
  Filled 2022-02-15: qty 5

## 2022-02-15 MED ORDER — SODIUM CHLORIDE FLUSH 0.9 % IV SOLN
INTRAVENOUS | Status: AC
  Administered 2022-02-15: 10 mL via INTRAVENOUS
  Filled 2022-02-15: qty 10

## 2022-02-18 ENCOUNTER — Ambulatory Visit (INDEPENDENT_AMBULATORY_CARE_PROVIDER_SITE_OTHER): Payer: Medicare HMO

## 2022-02-18 DIAGNOSIS — R55 Syncope and collapse: Secondary | ICD-10-CM | POA: Diagnosis not present

## 2022-03-09 NOTE — Addendum Note (Signed)
Addended by: Roseanne Reno on: 03/09/2022 08:37 AM   Modules accepted: Orders

## 2022-03-14 ENCOUNTER — Telehealth: Payer: Self-pay

## 2022-03-14 NOTE — Telephone Encounter (Signed)
 Patient notified and verbalized understanding. Pt had no questions or concerns at this time

## 2022-03-14 NOTE — Telephone Encounter (Signed)
-----   Message from Victoria Adkins, [place] sent at 03/14/2022  7:45 AM EDT ----- Monitor shows NSR with frequent PVC's-skipping from the bottom of the heart and some SVT -top of the heart. Nothing to cause her to pass out. Can review further at f/u with Dr. Debera in July

## 2022-04-04 ENCOUNTER — Ambulatory Visit: Payer: Medicare HMO | Admitting: Cardiology

## 2022-04-28 ENCOUNTER — Ambulatory Visit: Payer: Medicare HMO

## 2022-05-06 ENCOUNTER — Telehealth: Payer: Self-pay | Admitting: Cardiology

## 2022-05-06 NOTE — Telephone Encounter (Signed)
Reports dizziness has been going on for several months and seems to have gotten worse but says she is still able to drive. Denies chest pain or sob. Advised to keep appointment scheduled for Tuesday with Swinyer and if symptoms get worse between now and then, to go to the ED for an evaluation.

## 2022-05-06 NOTE — Telephone Encounter (Signed)
Pt c/o Syncope: STAT if syncope occurred within 30 minutes and pt complains of lightheadedness High Priority if episode of passing out, completely, today or in last 24 hours   Did you pass out today? no   When is the last time you passed out? On last Saturday(04-30-22) felt like she was going to pass out, she passed out  a month and a half ago   Has this occurred multiple times? yes   Did you have any symptoms prior to passing out? Sweat a lot and dizzy- She also says her  face turns blood red - patient wanted an appointment- I made her an appointment for Tuesday(05-10-22) with Elyse Jarvis - Please call to evaluate

## 2022-05-08 NOTE — Progress Notes (Deleted)
Cardiology Office Note:    Date:  05/08/2022   ID:  Victoria Adkins, DOB 07-28-1951, MRN [id number]  PCP:  Glenda Chroman, MD   [place] Prov[facility] Cardiologist:  Rozann Lesches, MD { Click to update primary MD,subspecialty MD or APP then REFRESH:1}    Referring MD: Glenda Chroman, MD   Chief Complaint: ***  History of Present Illness:    Victoria Adkins is a *** 71 y.o. female with a hx of COPD Gold 3, HTN, HLD  Referred for evaluation of neck shortness of breath, dyspnea on exertion, and intermittent chest pain and seen by Dr. Domenic Polite on 08/28/2019.  She reported chest pain more frequent when emotionally upset.  Lexiscan Myoview low risk, no obvious ischemia.  Echo with normal LVEF with mild diastolic dysfunction, mild aortic valve sclerosis without significant stenosis.  Repeat 2D echo 03/31/2021 normal LVEF with grade 1 DD, and mild aortic valve stenosis.  She was last seen in our office on 02/02/2022 by Ermalinda Barrios, PA for evaluation of syncope per PCP.  CT at [facility] 01/19/2022 showed small vessel disease no acute CVA.  Was walking into work and became dizzy and passed out at the beginning of April.  Was out for a few seconds, no other injuries.  Platelets were 79,000 on 01/22/2022 up to 136,000 at time of office visit.  She again passed out after having a liver ultrasound.  Got dizzy, fell face first and broke her nose.  Recent dyspnea with exertion improved with Breztri inhaler.  Chest tightness after eating and when she presses on her chest it goes away.  No exertional chest pain when working out with a trainer or walking her dog.  NST 02/15/2022 with no perfusion defects consistent with prior infarct or current ischemia, read as intermediate due to decreased LVEF 42%.  Recommendation to follow with echocardiogram.  Is no record of completing the echocardiogram.  Carotid Doppler revealed 1 to 39% bilateral ICA stenosis.  Cardiac monitor revealed predominant sinus rhythm average HR  68 bpm, rare PACs including atrial couplets and triplets representing less than 1% total beats, frequent PVCs representing 6.4% total beats with otherwise rare ventricular couplets and triplets representing less than 1% total beats.  No sustained ventricular events.  Multiple brief episodes of PSVT were noted with longest lasting 16.9 seconds with average HR 101 bpm.  No pauses, no patient triggered events. No finding in these studies to explain syncope.  She called our office 05/06/22 to report dizziness for several months. States she passed out about one and a half months ago and then again on 8/12 she felt like she was going to pass out again. Reports symptoms of dizziness, diaphoresis and her face turns blood red. Appointment was scheduled for evaluation.   Today, she is here for evaluation of symptoms as described above.   Past Medical History:  Diagnosis Date   Anxiety    Arthritis    COPD (chronic obstructive pulmonary disease) ([place])    Depression    Dyspnea    Hyperlipidemia     Past Surgical History:  Procedure Laterality Date   CATARACT EXTRACTION W/PHACO Left 04/20/2018   Procedure: CATARACT EXTRACTION PHACO AND INTRAOCULAR LENS PLACEMENT ([place]);  Surgeon: Baruch Goldmann, MD;  Location: [facility];  Service: Ophthalmology;  Laterality: Left;  CDE: 4.71   CATARACT EXTRACTION W/PHACO Right 05/04/2018   Procedure: CATARACT EXTRACTION PHACO AND INTRAOCULAR LENS PLACEMENT (I[facility]);  Surgeon: Baruch Goldmann, MD;  Location: [facility];  Service:  Ophthalmology;  Laterality: Right;  CDE: 5.56   CORRECTION HAMMER TOE Bilateral    REPLACEMENT TOTAL KNEE Right 2011   VAGINAL HYSTERECTOMY      Current Medications: No outpatient medications have been marked as taking for the 05/10/22 encounter (Appointment) with Lissa Hoard Zachary George, NP.     Allergies:   Patient has no known allergies.   Social History   Socioeconomic History   Marital status: Divorced    Spouse name: Not on file   Number of  children: 1   Years of education: Not on file   Highest education level: High school graduate  Occupational History   Not on file  Tobacco Use   Smoking status: Former    Packs/day: 1.00    Years: 20.00    Total pack years: 20.00    Types: Cigarettes    Quit date: 09/20/2007    Years since quitting: 14.6   Smokeless tobacco: Never  Vaping Use   Vaping Use: Never used  Substance and Sexual Activity   Alcohol use: Yes    Comment: Occasional   Drug use: No   Sexual activity: Not on file  Other Topics Concern   Not on file  Social History Narrative   Not on file   Social Determinants of Health   Financial Resource Strain: Low Risk  (04/12/2018)   Overall Financial Resource Strain (CARDIA)    Difficulty of Paying Living Expenses: Not hard at all  Food Insecurity: No Food Insecurity (04/12/2018)   Hunger Vital Sign    Worried About Running Out of Food in the Last Year: Never true    Ran Out of Food in the Last Year: Never true  Transportation Needs: No Transportation Needs (04/12/2018)   PRAPARE - Administrator, Civil Service (Medical): No    Lack of Transportation (Non-Medical): No  Physical Activity: Not on file  Stress: Not on file  Social Connections: Not on file     Family History: The patient's ***family history includes Alcohol abuse in her maternal grandfather; Blindness in her sister; Brain cancer in her mother; Dementia in her father; Emphysema in her father; Seizures in her sister.  ROS:   Please see the history of present illness.    *** All other systems reviewed and are negative.  Labs/Other Studies Reviewed:    The following studies were reviewed today:  Cardiac monitor 03/13/22  1.  Predominant rhythm is sinus with heart rate ranging from 48 bpm up to 125 bpm and average heart rate 68 bpm. 2.  There were rare PACs including atrial couplets and triplets representing less than 1% total beats. 3.  There were frequent PVCs representing 6.4% total  beats with otherwise rare ventricular couplets and triplets representing less than 1% total beats.  Episodes of ventricular bigeminy and trigeminy were also noted.  No sustained ventricular events. 4.  Multiple brief episodes of PSVT were noted.  Longest episode lasted for 16.9 seconds with average heart rate 101 bpm. 5.  There were no pauses. 6.  No patient triggered events.  Vas Carotid Duplex 02/19/22  Right Carotid: Velocities in the right ICA are consistent with a 1-39%  stenosis.   Left Carotid: Velocities in the left ICA are consistent with a 1-39%  stenosis.   Vertebrals: Bilateral vertebral arteries demonstrate antegrade flow.  Subclavians: Normal flow hemodynamics were seen in bilateral subclavian               arteries.   *See table(s)  above for measurements and observations.     Nuclear Stress Test 02/15/22    The study is normal. There are no perfusion defects consistent with prior infarct or current ischemia.  The study is intermediate risk based solely on decreased LVEF, consider correlating with echocardiogram.   No ST deviation was noted.   LV perfusion is normal.   Left ventricular function is abnormal. Nuclear stress EF: 42 %. The left ventricular ejection fraction is moderately decreased (30-44%). End diastolic cavity size is normal.  Echo 03/31/21   1. Left ventricular ejection fraction, by estimation, is 55 to 60%. The  left ventricle has normal function. The left ventricle has no regional  wall motion abnormalities. The left ventricular internal cavity size was  mildly dilated. Left ventricular  diastolic parameters are consistent with Grade I diastolic dysfunction  (impaired relaxation). Normal global longitudinal strain of -17.7%.   2. Right ventricular systolic function is normal. The right ventricular  size is normal. Tricuspid regurgitation signal is inadequate for assessing  PA pressure.   3. The mitral valve is grossly normal. Trivial mitral valve   regurgitation.   4. The aortic valve is tricuspid. There is mild calcification of the  aortic valve. Aortic valve regurgitation is mild. Mild aortic valve  stenosis. Aortic regurgitation PHT measures 566 msec. Aortic valve mean  gradient measures 10.0 mmHg.   5. The inferior vena cava is normal in size with greater than 50%  respiratory variability, suggesting right atrial pressure of 3 mmHg.   Comparison(s): Echocardiogram done 07/29/19 with [facility] showed an EF of  60-65%,.    Recent Labs: No results found for requested labs within last 365 days.  Recent Lipid Panel    Component Value Date/Time   CHOL 169 02/15/2022 1225   TRIG 142 02/15/2022 1225   HDL 53 02/15/2022 1225   CHOLHDL 3.2 02/15/2022 1225   VLDL 28 02/15/2022 1225   LDLCALC 88 02/15/2022 1225     Risk Assessment/Calculations:   {Does this patient have ATRIAL FIBRILLATION?[phone]}       Physical Exam:    VS:  There were no vitals taken for this visit.    Wt Readings from Last 3 Encounters:  02/02/22 216 lb 6.4 oz (98.2 kg)  03/26/21 219 lb (99.3 kg)  03/16/21 219 lb (99.3 kg)     GEN: *** Well nourished, well developed in no acute distress HEENT: Normal NECK: No JVD; No carotid bruits CARDIAC: ***RRR, no murmurs, rubs, gallops RESPIRATORY:  Clear to auscultation without rales, wheezing or rhonchi  ABDOMEN: Soft, non-tender, non-distended MUSCULOSKELETAL:  No edema; No deformity. *** pedal pulses, ***bilaterally SKIN: Warm and dry NEUROLOGIC:  Alert and oriented x 3 PSYCHIATRIC:  Normal affect   EKG:  EKG is *** ordered today.  The ekg ordered today demonstrates ***  No BP recorded.  {Refresh Note OR Click here to enter BP  :1}***    Diagnoses:    No diagnosis found. Assessment and Plan:     Syncope DOE: Hypertension: Hyperlipidemia:   {Are you ordering a CV Procedure (e.g. stress test, cath, DCCV, TEE, etc)?   Press F2        :[id number]}   Disposition:  Medication  Adjustments/Labs and Tests Ordered: Current medicines are reviewed at length with the patient today.  Concerns regarding medicines are outlined above.  No orders of the defined types were placed in this encounter.  No orders of the defined types were placed in this encounter.   There are no  Patient Instructions on file for this visit.   Signed, Emmaline Life, NP  05/08/2022 5:45 PM    [facility]

## 2022-05-10 ENCOUNTER — Ambulatory Visit: Payer: Medicare HMO | Admitting: Nurse Practitioner

## 2022-05-12 ENCOUNTER — Ambulatory Visit: Payer: Medicare HMO | Admitting: Cardiology

## 2022-05-12 ENCOUNTER — Encounter: Payer: Self-pay | Admitting: Cardiology

## 2022-05-12 VITALS — BP 120/70 | HR 75 | Ht 66.0 in | Wt 208.8 lb

## 2022-05-12 DIAGNOSIS — Z87898 Personal history of other specified conditions: Secondary | ICD-10-CM | POA: Diagnosis not present

## 2022-05-12 DIAGNOSIS — E782 Mixed hyperlipidemia: Secondary | ICD-10-CM

## 2022-05-12 NOTE — Patient Instructions (Signed)

## 2022-05-12 NOTE — Progress Notes (Signed)
Cardiology Office Note  Date: 05/12/2022   ID: Darcee, Dekker 1951/08/26, MRN [id number]  PCP:  Glenda Chroman, MD  Cardiologist:  Rozann Lesches, MD Electrophysiologist:  None   Chief Complaint  Patient presents with   Cardiac follow-up    History of Present Illness: Victoria Adkins is a 71 y.o. female last seen in May by Ms. Vita Barley, I reviewed the note (I last saw her in 2020).  She was seen at that time for evaluation of syncope.  Cardiac monitor obtained in May did show frequent PVCs representing approximately 6% total beats and otherwise fairly brief episodes of PSVT, none of which associated with syncope however.  Carotid Doppler showed mild ICA atherosclerosis.  She also underwent a Lexiscan Myoview which was negative for ischemia but indicated LVEF 42%.  Echocardiogram had been ordered as well, although I do not see that this was ever completed.  We discussed her symptoms today.  She describes 3 distinct episodes since April.  Each have occurred while standing, states that she feels a sensation of faintness, warmth and diaphoresis before it happens.  No chest pain or palpitations.  She was not found to be orthostatic today however, either by blood pressure or heart rate response.  Does mention being under a lot of stress with family concerns.  Past Medical History:  Diagnosis Date   Anxiety    Arthritis    COPD (chronic obstructive pulmonary disease) ([place])    Depression    Dyspnea    Hyperlipidemia     Past Surgical History:  Procedure Laterality Date   CATARACT EXTRACTION W/PHACO Left 04/20/2018   Procedure: CATARACT EXTRACTION PHACO AND INTRAOCULAR LENS PLACEMENT ([place]);  Surgeon: Baruch Goldmann, MD;  Location: [facility];  Service: Ophthalmology;  Laterality: Left;  CDE: 4.71   CATARACT EXTRACTION W/PHACO Right 05/04/2018   Procedure: CATARACT EXTRACTION PHACO AND INTRAOCULAR LENS PLACEMENT ([facility]);  Surgeon: Baruch Goldmann, MD;  Location: [facility];  Service:  Ophthalmology;  Laterality: Right;  CDE: 5.56   CORRECTION HAMMER TOE Bilateral    REPLACEMENT TOTAL KNEE Right 2011   VAGINAL HYSTERECTOMY      Current Outpatient Medications  Medication Sig Dispense Refill   atorvastatin (LIPITOR) 20 MG tablet Take 20 mg by mouth daily.     Blood Pressure Monitor KIT 1 each by Does not apply route as directed. Dx: diastolic dysfunction 1 kit 0   BREZTRI AEROSPHERE 160-9-4.8 MCG/ACT AERO Inhale into the lungs as needed.     buPROPion (WELLBUTRIN SR) 150 MG 12 hr tablet Take 150 mg by mouth 2 (two) times daily.     doxepin (SINEQUAN) 10 MG capsule Take 10 mg by mouth at bedtime.     ferrous sulfate 324 MG TBEC Take 324 mg by mouth as needed.     glycopyrrolate (ROBINUL) 2 MG tablet Take 2 mg by mouth daily.     latanoprost (XALATAN) 0.005 % ophthalmic solution 1 drop at bedtime.     meclizine (ANTIVERT) 12.5 MG tablet Take 12.5 mg by mouth 3 (three) times daily as needed for dizziness.     propranolol (INDERAL) 10 MG tablet Take 10 mg by mouth at bedtime.     temazepam (RESTORIL) 30 MG capsule Take 30 mg by mouth at bedtime.     No current facility-administered medications for this visit.   Allergies:  Patient has no known allergies.   ROS: No orthopnea or PND.  Physical Exam: VS:  BP 120/70  Pulse 75   Ht $R'5\' 6"'cy$  (1.676 m)   Wt 208 lb 12.8 oz (94.7 kg)   SpO2 95%   BMI 33.70 kg/m , BMI Body mass index is 33.7 kg/m.  Wt Readings from Last 3 Encounters:  05/12/22 208 lb 12.8 oz (94.7 kg)  02/02/22 216 lb 6.4 oz (98.2 kg)  03/26/21 219 lb (99.3 kg)    General: Patient appears comfortable at rest. HEENT: Conjunctiva and lids normal. Neck: Supple, no elevated JVP or carotid bruits, no thyromegaly. Lungs: Clear to auscultation, nonlabored breathing at rest. Cardiac: Regular rate and rhythm, no S3, 1/6 systolic murmur, no pericardial rub. Extremities: No pitting edema.  ECG:  An ECG dated 03/16/2021 was personally reviewed today and  demonstrated:  Sinus rhythm.  Recent Labwork:    Component Value Date/Time   CHOL 169 02/15/2022 1225   TRIG 142 02/15/2022 1225   HDL 53 02/15/2022 1225   CHOLHDL 3.2 02/15/2022 1225   VLDL 28 02/15/2022 1225   LDLCALC 88 02/15/2022 1225  May 2023: Hemoglobin 13.7, platelets 79, BUN 12, creatinine 0.79, potassium 4.2, AST 16, ALT 14, hemoglobin A1c 5.5%  Other Studies Reviewed Today:  Carotid Dopplers 02/18/2022: 1 to 39% bilateral ICA stenosis.  Cardiac monitor May 2023: ZIO XT reviewed.  11 days, 23 hours analyzed.  Predominant rhythm is sinus with heart rate ranging from 48 bpm up to 125 bpm and average heart rate 68 bpm.  There were rare PACs including atrial couplets and triplets representing less than 1% total beats.  There were frequent PVCs representing 6.4% total beats with otherwise rare ventricular couplets and triplets and limited episodes of ventricular bigeminy and trigeminy.  Multiple episodes of SVT were noted, the longest of which lasted approximately 17 seconds.  No pauses were noted.  [place] 02/15/2022:   The study is normal. There are no perfusion defects consistent with prior infarct or current ischemia.  The study is intermediate risk based solely on decreased LVEF, consider correlating with echocardiogram.   No ST deviation was noted.   LV perfusion is normal.   Left ventricular function is abnormal. Nuclear stress EF: 42 %. The left ventricular ejection fraction is moderately decreased (30-44%). End diastolic cavity size is normal.  Assessment and Plan:  1.  Episodic syncope, etiology not entirely clear as yet.  Description sounds somewhat like neurocardiogenic syncope based on description of warmth and diaphoresis prior to events, but they all occur when standing and not certain about other trigger.  She was not found to be orthostatic today.  Results of her cardiac monitor would not be an definite explanation.  She did have relatively frequent PVCs but  no sustained ventricular events.  Need to follow-up echocardiogram however to ensure normal LVEF (was calculated at 42% by Myoview).  We discussed hydration.  May try midodrine or Florinef if LVEF normal.  Otherwise, could always consider referral to EP for an ILR.  2.  Mixed hyperlipidemia, on Lipitor.  Last LDL 88.  Medication Adjustments/Labs and Tests Ordered: Current medicines are reviewed at length with the patient today.  Concerns regarding medicines are outlined above.   Tests Ordered: Orders Placed This Encounter  Procedures   ECHOCARDIOGRAM COMPLETE    Medication Changes: No orders of the defined types were placed in this encounter.   Disposition:  Follow up  3 months.  Signed, Satira Sark, MD, [facility] 05/12/2022 12:02 PM    [facility] [place] [facility] [place], [place], [place]  [place] Phone: [phone]; Fax: [phone]

## 2022-05-16 ENCOUNTER — Other Ambulatory Visit: Payer: Self-pay | Admitting: Family Medicine

## 2022-05-16 ENCOUNTER — Other Ambulatory Visit: Payer: Self-pay | Admitting: Cardiology

## 2022-05-16 ENCOUNTER — Ambulatory Visit: Payer: Medicare HMO | Admitting: Adult Health

## 2022-05-16 DIAGNOSIS — R0609 Other forms of dyspnea: Secondary | ICD-10-CM

## 2022-05-16 DIAGNOSIS — R55 Syncope and collapse: Secondary | ICD-10-CM

## 2022-05-17 ENCOUNTER — Ambulatory Visit: Payer: Medicare HMO | Admitting: Nurse Practitioner

## 2022-05-17 ENCOUNTER — Ambulatory Visit: Payer: Medicare HMO

## 2022-05-17 ENCOUNTER — Ambulatory Visit: Payer: Medicare HMO | Attending: Cardiology

## 2022-05-17 DIAGNOSIS — R55 Syncope and collapse: Secondary | ICD-10-CM

## 2022-05-17 LAB — ECHOCARDIOGRAM COMPLETE
AR max vel: 1.44 cm2
AV Area VTI: 1.46 cm2
AV Area mean vel: 1.31 cm2
AV Mean grad: 8.2 mmHg
AV Peak grad: 11.5 mmHg
AV Vena cont: 0.4 cm
Ao pk vel: 1.7 m/s
Area-P 1/2: 3.19 cm2
Calc EF: 47.6 %
P 1/2 time: 597 ms
S' Lateral: 3.57 cm
Single Plane A2C EF: 48.6 %
Single Plane A4C EF: 46 %

## 2022-05-17 NOTE — Progress Notes (Signed)
Patient referred to therapy.

## 2022-05-18 ENCOUNTER — Ambulatory Visit: Payer: Medicare HMO | Admitting: Psychiatry

## 2022-05-18 ENCOUNTER — Telehealth: Payer: Self-pay | Admitting: *Deleted

## 2022-05-18 MED ORDER — MIDODRINE HCL 2.5 MG PO TABS: 2.5000 mg | ORAL_TABLET | Freq: Two times a day (BID) | ORAL | 2 refills | Status: DC

## 2022-05-18 NOTE — Telephone Encounter (Signed)
Patient informed and verbalized understanding of plan. Copy sent to PCP 

## 2022-05-18 NOTE — Telephone Encounter (Signed)
-----   Message from Jonelle Sidle, MD sent at 05/17/2022  4:34 PM EDT ----- Results reviewed.  LVEF low normal range at 50%, higher than what was noted on Myoview in May.  As per my recent office note, would suggest that we try low-dose midodrine 2.5 mg twice daily to see if this has any impact on her intermittent spells.  Keep follow-up in place.

## 2022-05-19 ENCOUNTER — Ambulatory Visit (INDEPENDENT_AMBULATORY_CARE_PROVIDER_SITE_OTHER): Payer: Medicare HMO | Admitting: Psychiatry

## 2022-05-19 DIAGNOSIS — F411 Generalized anxiety disorder: Secondary | ICD-10-CM
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
## 2022-05-25 ENCOUNTER — Ambulatory Visit
Admission: RE | Admit: 2022-05-25 | Discharge: 2022-05-25 | Disposition: A | Discharge status: Home / Self Care | Payer: Medicare HMO | Source: Ambulatory Visit | Attending: Internal Medicine | Admitting: Internal Medicine

## 2022-05-25 DIAGNOSIS — Z1231 Encounter for screening mammogram for malignant neoplasm of breast: Secondary | ICD-10-CM

## 2022-06-07 ENCOUNTER — Ambulatory Visit: Payer: Medicare HMO | Admitting: Psychiatry

## 2022-06-07 DIAGNOSIS — F411 Generalized anxiety disorder: Secondary | ICD-10-CM | POA: Diagnosis not present

## 2022-06-07 NOTE — Progress Notes (Signed)
[facility] Counselor/Therapist Progress Note  Patient ID: Victoria Adkins, MRN: [id number],    Date: 06/07/2022  Time Spent: 55 minutes              Treatment Type: Individual Therapy  Reported Symptoms:  anxiety "high", easily upset, depression, cries easily and about a lot of things and "friends tell me they are tired of hearing me talk about it."  Mental Status Exam  Appearance:   Casual and Neat     Behavior:  Appropriate, Sharing, and Motivated  Motor:  Normal  Speech/Language:   Clear and Coherent  Affect:  Depressed, Tearful, and anxious  Mood:  anxious, depressed, and sad  Thought process:  goal directed  Thought content:    Rumination and some overthinking  Sensory/Perceptual disturbances:    WNL  Orientation:  oriented to person, place, time/date, situation, day of week, month of year, year, and stated date of Sept. 19, 2023  Attention:  Good  Concentration:  Good and Fair  Memory:  WNL  Fund of knowledge:   Good  Insight:    Good and Fair  Judgment:   Good  Impulse Control:  Good   Risk Assessment: Danger to Self:  No Self-injurious Behavior: No Danger to Others: No Duty to Warn:no Physical Aggression / Violence:No  Access to Firearms a concern: No  Gang Involvement:No   Subjective: Patient today reporting symptoms of sadness, anxiety, tearful, depression, and stated "I get upset easily and cry about a lot of things." Has about 6 friends but they get tired of me being upset and tell me that they are tired of it. Estranged from her adult (unmarried) daughter who is not very warm with patient per patient's report.  Daughter (only child) is adult livinng in [address], [place]. Patient lives in [place], [place]. Patient lives alone with her dog "Fredric Mare". Tearfully states "I just want to be happy" which would include having a better relationship with daughter, be moved to a better place from where she's living as some "bad neighbors" have moved in (with loudly barking dogs during  the night), "not really happy at my home nor with my daughter and relationship, wants to "stop crying at the drop of a hat as it embarasses me and my daughter if she's around, I need to accept things as they are, and "I need to get back in church at [employer]". Processed how she could follow through on some of these things and be more involved with others and also work through some of her difficult thoughts/feelings that she has been holding onto.  Encouraged her also to consider some outside interests as she was not really able to name any other than being with her friends which is good for her but sometimes friends are not available and she does not know quite how to best handle her time in ways that would be helpful for her.  Admits that she does dwell often on her anxious/negative thoughts and this will also be a priority in treatment planning with patient.  Interventions: Cognitive Behavioral Therapy and Ego-Supportive  Treatment Goal Plan: Patient not signing treatment plan on computer screen due to Covid. Treatment Goals: Treatment goals remain on treatment plan as patient worked with strategies to achieve her goals. Progress is assessed each session and documented in the "subjective" and/or "plan" sections of treatment note.  Long term goal: Reduce overall level, frequency, and intensity of the anxiety so that daily functioning is not impaired.  "Work  on not assuming the worst until it is proven different". Short term goal: Verbalize an  understanding of how thoughts, physical feelings, and behavioral actions contribute to anxiety and its treatment.  Work with behavioral changes that can help alleviate anxiety. Strategies: Complete anxiety homework exercises that identify cognitive distortions that generate anxious feelings.  Diagnosis:   ICD-10-CM   1. Generalized anxiety disorder  F41.1      Plan:  Patient today showing good motivation and actively participation in session as she openly  talked and shared her current symptoms and history leading up to them, as noted above in the subjective section of this note.  Looked at some steps that she knows she needs and wants to take in order to make some significant change in her life and be able to cope better on a daily basis.  Those areas that need changing are also noted in the subjective section of note and patient does seem motivated.  Encouraged to go ahead and start on the areas that we targeted today and she is to return within 2 weeks.  Shows good motivation but does have some significant self doubt.  Agreed to work on her anxious/negative thoughts in between sessions and trying not to assume worst case scenarios, which is a significant challenge for patient. Encouraged patient in the practice of positive behaviors as noted in session including: Staying in the present focusing on what she can control or change, understanding that some of her anxious thoughts are "just thoughts" and are not necessarily going to play out in reality, getting outside daily and walking, looking for more positives versus negatives daily, stay on her prescribed medication, healthy nutrition and exercise, refrain from assuming negatives and worst case scenarios, try practicing more positive self talk, refrain from self negating, practice more intentional listening to others that are helpful to her, reduce overthinking and over analyzing, letting go of things from the past that can hold her back in the present, work on some of the rigid ways of thinking that she has had over the years, and recognize the strength she shows when working with goal directed behaviors to move in a direction that supports her improved emotional health.  Goal review and progress/challenges noted with patient.  Next appointment within 2 to 3 weeks.  This record has been created using [employer].  Chart creation errors have been sought, but may not always have been located and corrected.   Such creation errors do not reflect on the standard of medical care provided.   Shanon Ace, LCSW

## 2022-06-28 ENCOUNTER — Ambulatory Visit: Payer: Medicare HMO | Admitting: Psychiatry

## 2022-06-28 DIAGNOSIS — F411 Generalized anxiety disorder: Secondary | ICD-10-CM | POA: Diagnosis not present

## 2022-06-28 NOTE — Progress Notes (Signed)
[facility] Counselor/Therapist Progress Note  Patient ID: Victoria Adkins, MRN: [id number],    Date: 06/28/2022  Time Spent: 55 minutes   Treatment Type: Individual Therapy  Reported Symptoms: anxiety, depression  Mental Status Exam:  Appearance:   Neat     Behavior:  Appropriate and Sharing  Motor:  Normal  Speech/Language:   Clear and Coherent  Affect:  Depressed and anxious  Mood:  anxious and depressed  Thought process:  goal directed  Thought content:    Some obsessive thoughts  Sensory/Perceptual disturbances:    WNL  Orientation:  oriented to person, place, time/date, situation, day of week, month of year, year, and stated date of Oct. 10, 2023  Attention:  Good  Concentration:  Good  Memory:  WNL  Fund of knowledge:   Good  Insight:    Good and Fair  Judgment:   Good  Impulse Control:  Good   Risk Assessment: Danger to Self:  No Self-injurious Behavior: No Danger to Others: No Duty to Warn:no Physical Aggression / Violence:No  Access to Firearms a concern: No  Gang Involvement:No   Subjective:  Patient in today reporting symptoms of anxiety, depression, frustration related to personal, family, problems with neighbors. Struggling with the recent death of her former "mate" (although we never married). Attended funeral with her adult daughter [place]. Separated from "mate" when she was several  months pregnant and never reunited. Today processing a lot of thoughts and feelings "not shared before". Lots of mixed feelings patient is finding very difficult and was able to share and get some support and clarity on today.  Difficulties with friends who she feels are "tired of me and they get upset with me".  That combined with issues with her adult daughter are overwhelming patient but she did well in session today.  No SI.  Especially upset re: relationship with her daughter which she worked on in session focusing mostly on some very hurtful situations and dynamics,  looking at what she can change or control, being able to eventually let go of painful thoughts/experiences with adult daughter, and wanting to live her life with fewer regrets/hurts, and try to move forward in a more positive direction.  Encouraged patient to continue considering some outside interests that she had mentioned previously and to be in touch with friends that are still supportive as she had mentioned 1 or 2.  Also encouraged her trying not to dwell on her anxious/negative thoughts as discussed in session today and we will continue working on when she returns.   Interventions: Cognitive Behavioral Therapy and Ego-Supportive  Treatment Goal Plan: Patient not signing treatment plan on computer screen due to Covid. Treatment Goals: Treatment goals remain on treatment plan as patient worked with strategies to achieve her goals. Progress is assessed each session and documented in the "subjective" and/or "plan" sections of treatment note.  Long term goal: Reduce overall level, frequency, and intensity of the anxiety so that daily functioning is not impaired.  "Work on not assuming the worst until it is proven different". Short term goal: Verbalize an  understanding of how thoughts, physical feelings, and behavioral actions contribute to anxiety and its treatment.  Work with behavioral changes that can help alleviate anxiety. Strategies: Complete anxiety homework exercises that identify cognitive distortions that generate anxious feelings.  Diagnosis:   ICD-10-CM   1. Generalized anxiety disorder  F41.1      Plan:   Patient today shows active participation and good motivation  in session as she focused on her anxiety, depression, and frustration all related to personal and family problems, some issues with neighbors, but more importantly with her adult daughter.  Significant issues in relationship with daughter that have been going on for some time and seem to be escalating.  Role-played  situation for patient offering some suggestions as to how she could respond to cert comments and circumstances with daughter.  Patient very tearful as she talked through her concerns and her work today but also acknowledging she has played a part in this over time.  She is so badly wanting to be able to have some type of relationship with her adult daughter and to live her life with fewer hurts and regrets, while being able to move forward in a more positive direction.  Did well in working in session today and seemed to feel a little lighter at the end of session, stating that it was good to be heard and not judged.  We will continue goal directed activity with patient in sessions.  Does seem to have a good amount of self doubt but also more motivated at end of session today.  Encouraged her again to try and not be assuming worst case scenarios which is a difficult task for her right now especially based on some events of the past, which patient is working to move beyond in sessions. In addition to her goal directed behaviors, I encouraged patient in her practice of more positive behaviors as noted in session including: Staying in the present focusing on what she can control or change, understand that some of her anxious thoughts are "just thoughts" and are not necessarily going to play out in reality, getting outside daily and walking, looking for more positives versus negatives each day, remain on her prescribed medication, healthy nutrition and exercise, refrain from assuming negatives and worst case scenarios, try practicing more positive self talk, refrain from self negating, practice more intentional listening to others that are helpful to her, reduce overthinking and over analyzing, letting go of things from the past that can hold her back in the present, work on some of the rigid ways of thinking that she has had over the years, and realize the strength she shows working with goal directed behaviors to move  in a direction that supports her improved emotional health and overall wellbeing.  Goal review and progress/challenges noted with patient.  Next appointment within 2 to 3 weeks.  This record has been created using [employer].  Chart creation errors have been sought, but may not always have been located and corrected.  Such creation errors do not reflect on the standard of medical care provided.   Shanon Ace, LCSW

## 2022-06-29 ENCOUNTER — Ambulatory Visit: Payer: Medicare HMO | Admitting: Cardiology

## 2022-07-19 ENCOUNTER — Ambulatory Visit: Payer: Medicare HMO | Admitting: Psychiatry

## 2022-07-19 DIAGNOSIS — F411 Generalized anxiety disorder: Secondary | ICD-10-CM

## 2022-07-19 NOTE — Progress Notes (Signed)
[facility] Counselor/Therapist Progress Note  Patient ID: Victoria Adkins, MRN: [id number],    Date: 07/19/2022  Time Spent: 55 minutes   Treatment Type: Individual Therapy  Reported Symptoms: anxiety, depression  Mental Status Exam:  Appearance:   Neat     Behavior:  Appropriate, Sharing, and Motivated  Motor:  Normal  Speech/Language:   Clear and Coherent  Affect:  Depressed and anxious  Mood:  anxious and depressed  Thought process:  goal directed  Thought content:    Rumination and some obsessiveness  Sensory/Perceptual disturbances:    WNL  Orientation:  oriented to person, place, time/date, situation, day of week, month of year, year, and stated date of Oct. 31, 2023  Attention:  Good  Concentration:  Fair  Memory:  Still some memory loss and states her PCP is aware and working with my issues  Fund of knowledge:   Good  Insight:    Good and Fair  Judgment:   Good  Impulse Control:  Good   Risk Assessment: Danger to Self:  No Self-injurious Behavior: No Danger to Others: No Duty to Warn:no Physical Aggression / Violence:No  Access to Firearms a concern: No  Gang Involvement:No   Subjective:  Patient in today reporting anxiety and depression and feels a lot of her current distress is related to her 28 year old daughter who lives in [place]. Very conflictual relationship with daughter. Today needing to share and process more of her concerns/sadness/hurt related to their lack of healthy relationship. Very painful for patient to think about and to talk about, as she tearfully discuss painful time in more recent past, including her hope and concerns for the future and the pain she is currently feeling. "Trying to have hope that some time I'll be able to have a relationship with my daughter and for now be able to better accept what is." Denies any SI.  Able to stop blaming herself as much today for some issues regarding relationship with daughter.  Continues to be very  hurtful but is talking about it more openly which is helpful to her.  Working on letting go more and realizing it is a process and not an event.  Wants to not feel so "regretful" and not feel like "this is on my back when I am trying to go forward".  Encouraged patient again on some outside interests that she has expressed and reaching out to friends that she feels are supportive.  She is to do some journaling in between sessions and bring with her next time.   Interventions: Cognitive Behavioral Therapy, Solution-Oriented/Positive Psychology, and Ego-Supportive  Treatment Goal Plan: Patient not signing treatment plan on computer screen due to Covid. Treatment Goals: Treatment goals remain on treatment plan as patient worked with strategies to achieve her goals. Progress is assessed each session and documented in the "subjective" and/or "plan" sections of treatment note.  Long term goal: Reduce overall level, frequency, and intensity of the anxiety so that daily functioning is not impaired.  "Work on not assuming the worst until it is proven different". Short term goal: Verbalize an  understanding of how thoughts, physical feelings, and behavioral actions contribute to anxiety and its treatment.  Work with behavioral changes that can help alleviate anxiety. Strategies: Complete anxiety homework exercises that identify cognitive distortions that generate anxious feelings.  Diagnosis:   ICD-10-CM   1. Generalized anxiety disorder  F41.1      Plan:  Patient in today showing active participation  and motivation focusing on significant relationship and communication issues between patient and her 33 year old daughter.  Patient did well in processing her concerns/sadness/hurt related to these issues with her daughter and also realizing that this is a work in progress and change may not happen fast, especially based on some of the details patient shared.  Very painful for patient.  Has been able to  reduce the blaming of herself and working through some regrets from the past.  Encouraged her to be in touch with friends that she trusts as well as activities she enjoys, and also doing some journaling in between sessions as noted above. Encouraged patient and practicing more positive behaviors as noted in session including: Remaining in the present focusing on what she can control or change, understanding that some of her anxious thoughts are "just thoughts" and are not necessarily going to play out in reality, get outside some each day and walk, looking for more positives versus negatives daily, stay on her prescribed medication, healthy nutrition and exercise, refrain from assuming negatives and worst-case scenarios, try practicing more positive self talk, refrain from self negating, practice more intentional listening to others that are helpful to her, reduce overthinking and over analyzing, letting go of things from the past that can hold her back in the present, work on some of the rigid ways of thinking that she has had over the years, and recognize the strength she shows working with goal-directed behaviors to move in a direction that supports her improved emotional health.  Goal review and progress/challenges noted with patient.  Next appointment within 2 to 3 weeks.  This record has been created using [employer].  Chart creation errors have been sought, but may not always have been located and corrected.  Such creation errors do not reflect on the standard of medical care provided.   Shanon Ace, LCSW

## 2022-08-02 ENCOUNTER — Encounter: Payer: Self-pay | Admitting: Psychiatry

## 2022-08-02 ENCOUNTER — Ambulatory Visit: Payer: Medicare HMO | Admitting: Psychiatry

## 2022-08-02 DIAGNOSIS — F411 Generalized anxiety disorder: Secondary | ICD-10-CM | POA: Diagnosis not present

## 2022-08-02 NOTE — Progress Notes (Signed)
[facility] Counselor/Therapist Progress Note  Patient ID: Victoria Adkins, MRN: [id number],    Date: 08/02/2022  Time Spent: 55 minutes  Treatment Type: Individual Therapy  Reported Symptoms: anxiety, depression, obsessive thoughts  Mental Status Exam:  Appearance:   Neat     Behavior:  Appropriate, Sharing, and Motivated  Motor:  Normal  Speech/Language:   Clear and Coherent  Affect:  Depressed and anxious  Mood:  anxious and depressed  Thought process:  goal directed  Thought content:    Obsesssive thoughts  Sensory/Perceptual disturbances:    WNL  Orientation:  oriented to person, place, time/date, situation, day of week, month of year, year, and stated date of Nov. 16, 2023  Attention:  Good  Concentration:  Good and Fair  Memory:  WNL  Fund of knowledge:   Good  Insight:    Good and Fair  Judgment:   Good  Impulse Control:  Good   Risk Assessment: Danger to Self:  No Self-injurious Behavior: No Danger to Others: No Duty to Warn:no Physical Aggression / Violence:No  Access to Firearms a concern: No  Gang Involvement:No   Subjective:   Patient in today reporting reduced crying episodes and "a friend told her this past weekend that it was the first time she had seen patient laugh in a long, long time."Followed through in writing in her journal. Indicates "up and down" in her mood sometimes hours apart and gave example over the past 24 hours. Saw her "PCP recently and got Buproprion and Alprazalam. "  States she's not sure it's working and may want to see a med provider here at [facility]. Enjoyed an outing at [facility] within past few days and states "I had a great time, laughed and talked a lot. Today focused more on difficult relationship with her 74 year old daughter. Communication very frustrating and hurtful with daughter and patient needed session today to process and focus on relationship with her daughter. Per patient report, patient does treat patient in  ways that are very hurtful. Is a very sad relationship for patient. Is affecting her friendships because of "constantly talking about her and daughter's problems to friends." Seemed today to gain some insight into herself and her daughter's behavior and words. To continue to use journaling as a tool for self expression and sharing between sessions and bring in with her next visit.   Interventions: Cognitive Behavioral Therapy and Ego-Supportive  Long term goal: Reduce overall level, frequency, and intensity of the anxiety so that daily functioning is not impaired.  "Work on not assuming the worst until it is proven different". Short term goal: Verbalize an  understanding of how thoughts, physical feelings, and behavioral actions contribute to anxiety and its treatment.  Work with behavioral changes that can help alleviate anxiety. Strategies: Complete anxiety homework exercises that identify cognitive distortions that generate anxious feelings.  Diagnosis:   ICD-10-CM   1. Generalized anxiety disorder  F41.1      Plan: Patient today showing good motivation and active participation in session focusing on ongoing significant relationship and communication issues with her and her 75 year old daughter, and also in some of her relationships with friends where she over talked about the issues between her and daughter.  Role-played this some in session today to help patient better understand the perception of her friends and also to try and increase patient's understanding of the situation between her and daughter.  To continue with goal-directed behaviors and also using journaling  as a tool between sessions.  Did some good work today in session including showing some insight as to some of the changes that she is needing to work on. Encourage patient in her practice of more positive behaviors as noted in session including: Staying in the present focusing on what she can change her control, understanding  that some of her anxious thoughts are "just thoughts" and are not necessarily going to play out reality, get outside some each day, look for more positives versus negatives daily, stay on her prescribed medication, healthy nutrition and exercise, refrain from assuming negatives and worst-case scenarios, try practicing more positive self talk, refrain from self negating, practice more intentional listening to others that are helpful to her, reduce overthinking and over analyzing, letting go of things from the past that hold her back in the present, work on some of the rigid ways of thinking that she has had over the years and decreased them, and recognize the strength she shows working with goal-directed behaviors to move in a direction that supports her improved emotional health and overall wellbeing.  Goal review and progress/challenges noted with patient.  Next appointment within 3 weeks.  This record has been created using [employer].  Chart creation errors have been sought, but may not always have been located and corrected.  Such creation errors do not reflect on the standard of medical care provided.   Mathis Fare, LCSW

## 2022-08-09 ENCOUNTER — Ambulatory Visit: Payer: Medicare HMO | Admitting: Psychiatry

## 2022-08-15 ENCOUNTER — Ambulatory Visit: Payer: Medicare HMO | Admitting: Psychiatry

## 2022-08-15 DIAGNOSIS — F411 Generalized anxiety disorder: Secondary | ICD-10-CM

## 2022-08-15 NOTE — Progress Notes (Signed)
[facility] Counselor/Therapist Progress Note  Patient ID: Victoria Adkins, MRN: [id number],    Date: 08/15/2022  Time Spent: 50 minutes   Treatment Type: Individual Therapy  Reported Symptoms: anxiety, depression, sadness, anger, hurt  Mental Status Exam:  Appearance:   Casual     Behavior:  Appropriate, Sharing, and Motivated  Motor:  Normal  Speech/Language:   Clear and Coherent  Affect:  Depressed and anxious  Mood:  anxious and depressed  Thought process:  goal directed  Thought content:    Rumination  Sensory/Perceptual disturbances:    WNL  Orientation:  oriented to person, place, time/date, situation, day of week, month of year, year, and stated date of Nov. 27, 2023  Attention:  Fair  Concentration:  Fair  Memory:  Some short term memory issues reported and her Dr is aware  Fund of knowledge:   Good  Insight:    Good and Fair  Judgment:   Good and Fair  Impulse Control:  Fair   Risk Assessment: Danger to Self:  No Self-injurious Behavior: No Danger to Others: No Duty to Warn:no Physical Aggression / Violence:No  Access to Firearms a concern: No  Gang Involvement:No   Subjective:   Patient in today reporting anxiety, depression, sadness, hurt, and anger. Discussed how Thanksgiving was difficult not seeing her adult daughter and feeling mistreated by her. Dreading Christmas and assuming "the worst" with daughter. Difficulty dealing limits and things out of her control, which she processed more in session today. Sadness re: relationship with daughter. "It hurts my heart." Further processed her sadness in detail and explored options together related to her wanting to reach out to daughter more. Depression "some worse due to the way Thanksgiving was and not seeing daughter." Using her journal to write between sessions.  Processed some communication skills that might be helpful for her with her daughter but also emphasized respecting daughter's boundaries may also  have a positive outcome.  Reports that she does think some extended family are going to invite her to be with them at Christmas which is a comfort for her.  Continue to work on the hurtfullness that patient feels in contact with daughter and also some significant communication issues that are continuing between the 2 of them.  Also discussed some of the communication issues that are hurting some of patient's friendships as "they feel that I am overly talking about the issues between me and my daughter and they do not really want to keep.".  Processed this feedback with patient and we will follow-up again next session.  Interventions: Cognitive Behavioral Therapy and Ego-Supportive  Long term goal: Reduce overall level, frequency, and intensity of the anxiety so that daily functioning is not impaired.  "Work on not assuming the worst until it is proven different". Short term goal: Verbalize an  understanding of how thoughts, physical feelings, and behavioral actions contribute to anxiety and its treatment.  Work with behavioral changes that can help alleviate anxiety. Strategies: Complete anxiety homework exercises that identify cognitive distortions that generate anxious feelings.  Diagnosis:   ICD-10-CM   1. Generalized anxiety disorder  F41.1      Plan:  Patient today actively participating in session and showing good motivation as she focused more on the difficulty she feels in relationship to daughter.  Her primary feeling is rejection which we discussed more today.  Dreading the holidays but does have an offer from someone else in the family to be with them at  Christmas time which she is appreciative for.  Having a very difficult time working through all of the ways she feels her daughter has hurt her and continues to do so, yet longs for a relationship with the daughter.  Very challenged in having healthy boundaries and keeping them.  We will follow-up again with these issues next session.  To  continue with goal-directed behaviors and encourage patient also to use her journaling as a tool between sessions as well as responding to people that reach out to her and friendship. Encouraged patient in her practice of more positive behaviors as discussed in session including: Remaining in the present focusing on what she can control her change, understand that some of her anxious thoughts are "just thoughts" and are not necessarily going to play out in reality, get outside some each day, look for more positives versus negatives daily, remain on her prescribed medication, refrain from assuming negatives and worst-case scenarios, healthy nutrition and exercise, try practicing more positive self talk, refrain from self negating, practice more intentional listening to others that are helpful to her, reduce her overthinking and overanalyzing, letting go of things from the past that hold her back now in the present, be willing to work with some of the rigid ways of thinking that she has had over the years and decreased them, and realize the strength she shows working with goal-directed behaviors to move in a direction that supports her improved emotional health.  Goal review and progress/challenges noted with patient.  Next appointment within 3 weeks.  This record has been created using [employer].  Chart creation errors have been sought, but may not always have been located and corrected.  Such creation errors do not reflect on the standard of medical care provided.   Mathis Fare, LCSW

## 2022-08-16 ENCOUNTER — Ambulatory Visit: Payer: Medicare HMO | Admitting: Psychiatry

## 2022-08-22 ENCOUNTER — Encounter: Payer: Self-pay | Admitting: Cardiology

## 2022-08-22 ENCOUNTER — Ambulatory Visit: Payer: Medicare HMO | Attending: Cardiology | Admitting: Cardiology

## 2022-08-22 VITALS — BP 126/80 | HR 67 | Ht 66.0 in | Wt 208.0 lb

## 2022-08-22 DIAGNOSIS — E782 Mixed hyperlipidemia: Secondary | ICD-10-CM | POA: Diagnosis not present

## 2022-08-22 DIAGNOSIS — Z87898 Personal history of other specified conditions: Secondary | ICD-10-CM | POA: Diagnosis not present

## 2022-08-22 NOTE — Patient Instructions (Addendum)

## 2022-08-22 NOTE — Progress Notes (Signed)
Cardiology Office Note  Date: 08/22/2022   ID: Victoria Adkins, Victoria Adkins 1950-12-11, MRN [id number]  PCP:  Glenda Chroman, MD  Cardiologist:  Rozann Lesches, MD Electrophysiologist:  None   Chief Complaint  Patient presents with   Cardiac follow-up    History of Present Illness: EULANDA Adkins is a 71 y.o. female last seen in August.  She is here for a follow-up visit.  Reports no episodes of dizziness or syncope since we started midodrine.  She has had no exertional chest pain or shortness of breath beyond NYHA class II.  No palpitations.  Cardiac testing as noted below including most recently an echocardiogram demonstrating LVEF approximately 50% with mild diastolic dysfunction, normal RV contraction, and no major valvular abnormalities.  I reviewed the remainder of her medications.  She continues to follow regularly with PCP.  Past Medical History:  Diagnosis Date   Anxiety    Arthritis    COPD (chronic obstructive pulmonary disease) ([place])    Depression    Dyspnea    Hyperlipidemia     Past Surgical History:  Procedure Laterality Date   CATARACT EXTRACTION W/PHACO Left 04/20/2018   Procedure: CATARACT EXTRACTION PHACO AND INTRAOCULAR LENS PLACEMENT ([place]);  Surgeon: Baruch Goldmann, MD;  Location: [facility];  Service: Ophthalmology;  Laterality: Left;  CDE: 4.71   CATARACT EXTRACTION W/PHACO Right 05/04/2018   Procedure: CATARACT EXTRACTION PHACO AND INTRAOCULAR LENS PLACEMENT ([facility]);  Surgeon: Baruch Goldmann, MD;  Location: [facility];  Service: Ophthalmology;  Laterality: Right;  CDE: 5.56   CORRECTION HAMMER TOE Bilateral    REPLACEMENT TOTAL KNEE Right 2011   VAGINAL HYSTERECTOMY      Current Outpatient Medications  Medication Sig Dispense Refill   atorvastatin (LIPITOR) 20 MG tablet Take 20 mg by mouth daily.     Blood Pressure Monitor KIT 1 each by Does not apply route as directed. Dx: diastolic dysfunction 1 kit 0   BREZTRI AEROSPHERE 160-9-4.8 MCG/ACT AERO Inhale into the  lungs as needed.     doxepin (SINEQUAN) 10 MG capsule Take 10 mg by mouth at bedtime.     ferrous sulfate 324 MG TBEC Take 324 mg by mouth as needed.     glycopyrrolate (ROBINUL) 2 MG tablet Take 2 mg by mouth daily.     latanoprost (XALATAN) 0.005 % ophthalmic solution 1 drop at bedtime.     meclizine (ANTIVERT) 12.5 MG tablet Take 12.5 mg by mouth 3 (three) times daily as needed for dizziness.     midodrine (PROAMATINE) 2.5 MG tablet Take 1 tablet (2.5 mg total) by mouth 2 (two) times daily with a meal. 60 tablet 2   propranolol (INDERAL) 10 MG tablet Take 10 mg by mouth at bedtime.     temazepam (RESTORIL) 30 MG capsule Take 30 mg by mouth at bedtime.     No current facility-administered medications for this visit.   Allergies:  Patient has no known allergies.   ROS: No orthopnea or PND.  Physical Exam: VS:  BP 126/80   Pulse 67   Ht 5' 6" (1.676 m)   Wt 208 lb (94.3 kg)   SpO2 97%   BMI 33.57 kg/m , BMI Body mass index is 33.57 kg/m.  Wt Readings from Last 3 Encounters:  08/22/22 208 lb (94.3 kg)  05/12/22 208 lb 12.8 oz (94.7 kg)  02/02/22 216 lb 6.4 oz (98.2 kg)    General: Patient appears comfortable at rest. HEENT: Conjunctiva and lids normal. Neck: Supple,  no elevated JVP or carotid bruits. Lungs: Clear to auscultation, nonlabored breathing at rest. Cardiac: Regular rate and rhythm, no S3, 1/6 systolic murmur. Extremities: No pitting edema.  ECG:  An ECG dated 03/16/2021 was personally reviewed today and demonstrated:  Sinus rhythm.  Recent Labwork:    Component Value Date/Time   CHOL 169 02/15/2022 1225   TRIG 142 02/15/2022 1225   HDL 53 02/15/2022 1225   CHOLHDL 3.2 02/15/2022 1225   VLDL 28 02/15/2022 1225   LDLCALC 88 02/15/2022 1225  July 2023: Hemoglobin 13.4, platelets 120, BUN 9, creatinine 0.85, potassium 4.4, AST 16, ALT 28 April 2022: Cholesterol 159, triglycerides 168, HDL 54, LDL 77, AST 15, ALT 16  Other Studies Reviewed Today:  Carotid  Dopplers 02/18/2022: 1 to 39% bilateral ICA stenosis.   Cardiac monitor May 2023: ZIO XT reviewed.  11 days, 23 hours analyzed.  Predominant rhythm is sinus with heart rate ranging from 48 bpm up to 125 bpm and average heart rate 68 bpm.  There were rare PACs including atrial couplets and triplets representing less than 1% total beats.  There were frequent PVCs representing 6.4% total beats with otherwise rare ventricular couplets and triplets and limited episodes of ventricular bigeminy and trigeminy.  Multiple episodes of SVT were noted, the longest of which lasted approximately 17 seconds.  No pauses were noted.   [place] 02/15/2022:   The study is normal. There are no perfusion defects consistent with prior infarct or current ischemia.  The study is intermediate risk based solely on decreased LVEF, consider correlating with echocardiogram.   No ST deviation was noted.   LV perfusion is normal.   Left ventricular function is abnormal. Nuclear stress EF: 42 %. The left ventricular ejection fraction is moderately decreased (30-44%). End diastolic cavity size is normal.  Echocardiogram 05/17/2022: 1. Left ventricular ejection fraction, by estimation, is 50%. The left  ventricle has low normal function. The left ventricle has no regional wall  motion abnormalities. Left ventricular diastolic parameters are consistent  with Grade I diastolic  dysfunction (impaired relaxation). The average left ventricular global  longitudinal strain is -18.1 %. The global longitudinal strain is normal.   2. Right ventricular systolic function is normal. The right ventricular  size is normal. Tricuspid regurgitation signal is inadequate for assessing  PA pressure.   3. The mitral valve is normal in structure. No evidence of mitral valve  regurgitation. No evidence of mitral stenosis.   4. The aortic valve is tricuspid. There is mild calcification of the  aortic valve. There is mild thickening of the aortic  valve. Aortic valve  regurgitation is mild to moderate.   5. The inferior vena cava is normal in size with greater than 50%  respiratory variability, suggesting right atrial pressure of 3 mmHg.   Assessment and Plan:  1.  History of syncope, potentially neurocardiogenic although not absolutely defined.  She does feel better since starting on midodrine, no recurrent symptoms.  No sense of chest pain or palpitations.  ECG with normal intervals, no preexcitation or Brugada pattern.  No evidence of ischemia by Myoview and LVEF approximately 50% by echocardiogram with normal RV contraction and no major valvular abnormalities.  She did have 6% PVCs by cardiac monitoring but no sustained ventricular events and relatively brief episodes of SVT.  Continue with observation for now.  2.  Mixed hyperlipidemia on Lipitor.  Last LDL 77.  Keep follow-up with PCP.  Medication Adjustments/Labs and Tests Ordered: Current medicines are  reviewed at length with the patient today.  Concerns regarding medicines are outlined above.   Tests Ordered: No orders of the defined types were placed in this encounter.   Medication Changes: No orders of the defined types were placed in this encounter.   Disposition:  Follow up  6 months.  Signed, Satira Sark, MD, [facility] 08/22/2022 1:47 PM    [facility], [place], [place] [address] Phone: [phone]; Fax: [phone]

## 2022-08-29 ENCOUNTER — Ambulatory Visit: Payer: Medicare HMO | Admitting: Psychiatry

## 2022-08-29 DIAGNOSIS — F411 Generalized anxiety disorder: Secondary | ICD-10-CM | POA: Diagnosis not present

## 2022-08-29 NOTE — Progress Notes (Signed)
[facility] Counselor/Therapist Progress Note  Patient ID: CACHET MCCUTCHEN, MRN: [id number],    Date: 08/29/2022  Time Spent: 55 minutes   Treatment Type: Individual Therapy  Reported Symptoms: anxiety, less depression  Mental Status Exam:  Appearance:   Casual and Neat     Behavior:  Appropriate, Sharing, and Motivated  Motor:  Normal  Speech/Language:   Clear and Coherent  Affect:  Depressed and anxiety  Mood:  anxious and depressed  Thought process:  goal directed  Thought content:    WNL and Rumination  Sensory/Perceptual disturbances:    WNL  Orientation:  oriented to person, place, time/date, situation, day of week, month of year, year, and stated date of Dec. 11, 2023  Attention:  Good  Concentration:  Good and Fair  Memory:  WNL  Fund of knowledge:   Good  Insight:    Good and Fair  Judgment:   Good  Impulse Control:  Fair   Risk Assessment: Danger to Self:  No Self-injurious Behavior: No Danger to Others: No Duty to Warn:no Physical Aggression / Violence:No  Access to Firearms a concern: No  Gang Involvement:No   Subjective: Patient today reporting anxiety as main symptom and also depression. Still having some hurt, sadness, and anger related to relationship with daughter.  Dreading Christmas still and assuming the worse with daughter. Did see daughter this weekend while staying with friend for weekend. Daughter initiated this contact which she states was unusual. Discussed a more personal issue re: adult daughter and seemed to gain some clarity. (Not all details included in this note due to patient privacy needs.) Realizing more that she can't change her daughter but wanting to be open to improved relationship with her. (Crying re: relationship with daughter that has been very difficult over past 3 yrs and processed a lot of hurt and sadness, which seemed helpful to patient.) Struggles with things being out of her control, especially in relationships within  family.  Depression decreased some since weekend and concerned about not maintaining her gains as her depression has decreased some this past week, and seemed to think speaking with daughter did help as daughter was not as antagonistic, and patient doesn't want to "shut a door" on their relationship as she hopes it can get stronger over the years. Still using her journal to write between sessions. Working on Manufacturing systems engineer that can especially be helpful with communication with others and patient's overall outlook.   Interventions: Cognitive Behavioral Therapy, Solution-Oriented/Positive Psychology, and Ego-Supportive  Long term goal: Reduce overall level, frequency, and intensity of the anxiety so that daily functioning is not impaired.  "Work on not assuming the worst until it is proven different". Short term goal: Verbalize an  understanding of how thoughts, physical feelings, and behavioral actions contribute to anxiety and its treatment.  Work with behavioral changes that can help alleviate anxiety. Strategies: Complete anxiety homework exercises that identify cognitive distortions that generate anxious feelings.   Diagnosis:   ICD-10-CM   1. Generalized anxiety disorder  F41.1      Plan: Patient today showing good participation in session working on her anxiety and depression related to relationship issues and not giving so much power away to people who behave in disrespectful ways to patient.  We used several specific examples that patient has experienced within the last 2 to 3 weeks which was helpful to patient to see a different strategy being used.  It is obvious in her talking and in her  behavior that she is gaining some strength.  Commits to trying to work on "not always imagining the worst case scenarios".  Discussed ways that she might have some success in doing this.  Continues to struggle with things that are out of her control and focused on this some today which she agrees to  work on between sessions.  Making good decisions about relationship with daughter as she stated she does "not want to shut the door" relationship as she hopes it can get better and trying newer strategies and working on some issues for her own self and therapy. Encouraged patient and practicing more positive behaviors as noted in session including: Staying in the present focusing on what she can control or change, understand that some of her anxious thoughts are "just thoughts" and are not necessarily going to play out in reality, get outside some each day, look for more positives versus negatives each day, stay on her prescribed medication, refrain from assuming negatives and worst-case scenarios, healthy nutrition and exercise, try practicing more positive self talk, refrain from self negating, practice more intentional listening to others that are helpful to her, reduce her overthinking and over analyzing, letting go of things from the past that hold her back now in the present, be willing to work with some of the rigid ways of thinking that she has had over the years and decrease them, and recognize the strength she shows working with goal-directed behaviors to move in a direction that supports her improved emotional health and overall wellbeing.  Goal review and progress/challenges noted with patient.  Next appointment within 2 to 3 weeks.  This record has been created using [employer].  Chart creation errors have been sought, but may not always have been located and corrected.  Such creation errors do not reflect on the standard of medical care provided.   Mathis Fare, LCSW

## 2022-08-30 ENCOUNTER — Other Ambulatory Visit: Payer: Self-pay | Admitting: Cardiology

## 2022-09-15 ENCOUNTER — Ambulatory Visit (INDEPENDENT_AMBULATORY_CARE_PROVIDER_SITE_OTHER): Payer: Medicare HMO | Admitting: Psychiatry

## 2022-09-15 DIAGNOSIS — F411 Generalized anxiety disorder: Secondary | ICD-10-CM | POA: Diagnosis not present

## 2022-09-15 NOTE — Progress Notes (Signed)
[facility] Counselor/Therapist Progress Note  Patient ID: Victoria Adkins, MRN: [id number],    Date: 09/15/2022  Time Spent: 48 minutes   Treatment Type: Individual Therapy  Reported Symptoms: anxiety, some tearfulness re: holidays, depression "some better"  Mental Status Exam:  Appearance:   Casual     Behavior:  Appropriate, Sharing, and Motivated  Motor:  Normal  Speech/Language:   Clear and Coherent  Affect:  Depressed and anxious  Mood:  anxious and some depression  Thought process:  goal directed  Thought content:    Rumination  Sensory/Perceptual disturbances:    WNL  Orientation:  oriented to person, place, time/date, situation, day of week, month of year, year, and stated date of Dec. 28, 2023  Attention:  Good  Concentration:  Good and Fair  Memory:  WNL  Fund of knowledge:   Good  Insight:    Good and Fair  Judgment:   Good  Impulse Control:  Good and Fair   Risk Assessment: Danger to Self:  No Self-injurious Behavior: No Danger to Others: No Duty to Warn:no Physical Aggression / Violence:No  Access to Firearms a concern: No  Gang Involvement:No   Subjective: Patient in today expressing anxiety and hurt/sadness, mostly related to troubled relationship with her 72 yr old daughter, that is hurtful and difficult for patient.  Patient today processing a lot of her hurt especially in relationship with daughter and wanting to talk with daughter about their relationship. She is unsure as to what would help or hurt in that situation.  Did tend to take her thoughts and feel this in a negative direction towards daughter whose relationship with patient has been difficult especially in the past couple of years.  Working with patient to think through her behaviors and words so as to not self sabotage the potential for an eventual positive relationship with her daughter.  This is difficult for patient in some ways however we talked through advantages/disadvantages and  patient is to think more about this between now and next session and make some notes to bring in with her.  A lot of patient's turmoil and pain right now is in reference to relationship with daughter.  Less tearfulness today.  Depression decreased some.  Continued work on Manufacturing systems engineer and thinking through certain decisions more carefully.  Interventions: Cognitive Behavioral Therapy, Solution-Oriented/Positive Psychology, and Ego-Supportive  Long term goal: Reduce overall level, frequency, and intensity of the anxiety so that daily functioning is not impaired.  "Work on not assuming the worst until it is proven different". Short term goal: Verbalize an  understanding of how thoughts, physical feelings, and behavioral actions contribute to anxiety and its treatment.  Work with behavioral changes that can help alleviate anxiety. Strategies: Complete anxiety homework exercises that identify cognitive distortions that generate anxious feelings.   Diagnosis:   ICD-10-CM   1. Generalized anxiety disorder  F41.1      Plan: Patient today actively involved in session as she worked further on her depression and anxiety although depression has improved.  Some tearfulness.  As noted above she confronted some difficult issues in session today with her adult daughter where there is been increasing stress and friction over the past couple of years.  (Not all details included in this note due to patient privacy needs).  Patient trying to refrain from being impulsive and her thoughts and words and is to give more thought between sessions about how she wants to continue working on relationship with  daughter.  Encouraged her to make some written notes on her priorities as well as pain she is feeling about the relationship.  Trying to not always assume worst-case scenarios and this has proven to be difficult for her. Encouraged patient in her practice of more positive behaviors as noted in session including:  Remaining in the present and focus on what she can control or change, understand that some of her anxious thoughts are "just thoughts" and are not necessarily going to play out in reality, get outside some each day, look for more positives versus negatives daily, stay on her prescribed medication, refrain from assuming negatives and worst-case scenarios, healthy nutrition and exercise, practicing more positive self talk, refrain from self negating, practice more intentional listening to others that are helpful to her, reduce her overthinking and over analyzing, letting go of things from the past that hold her back now, be willing to work with some of the rigid ways of thinking that she has had over the years and decreased them, and realize the strength she shows working with goal-directed behaviors to move in a direction that supports her improved emotional health.  Goal review and progress/challenges noted with patient.  Next appointment within 3 weeks.  This record has been created using [employer].  Chart creation errors have been sought, but may not always have been located and corrected.  Such creation errors do not reflect on the standard of medical care provided.   Mathis Fare, LCSW

## 2022-10-06 ENCOUNTER — Ambulatory Visit: Payer: Medicare HMO | Admitting: Psychiatry

## 2022-10-27 ENCOUNTER — Ambulatory Visit: Payer: Medicare HMO | Admitting: Psychiatry

## 2022-10-27 NOTE — Progress Notes (Unsigned)
      [facility] Counselor/Therapist Progress Note  Patient ID: NALEA SALCE, MRN: [id number],    Date: 10/27/2022  Time Spent: ***   Treatment Type: {[facility] AMB THERAPY TYPES:[id number]}  Reported Symptoms: ***  Mental Status Exam:  Appearance:   {PSY:22683}     Behavior:  {PSY:[id number]}  Motor:  {PSY:22302}  Speech/Language:   {PSY:22685}  Affect:  {PSY:22687}  Mood:  {PSY:31886}  Thought process:  {PSY:31888}  Thought content:    {[phone]}  Sensory/Perceptual disturbances:    {PSY:332 850 9923}  Orientation:  {PSY:30297}  Attention:  {PSY:22877}  Concentration:  {[phone]}  Memory:  {PSY:[phone]}  Fund of knowledge:   {[phone] [phone]}  Insight:    {[phone] [phone]}  Judgment:   {[phone] [phone]}  Impulse Control:  {[phone] [phone]}   Risk Assessment: Danger to Self:  {PSY:22692} Self-injurious Behavior: {PSY:22692} Danger to Others: {PSY:22692} Duty to Warn:{PSY:311194} Physical Aggression / Violence:{PSY:21197} Access to Firearms a concern: {PSY:21197} Gang Involvement:{PSY:21197}  Subjective: ***     Interventions: {PSY:[id number]}  Diagnosis:No diagnosis found.  Plan: ***   Shanon Ace, LCSW

## 2022-11-03 ENCOUNTER — Ambulatory Visit: Payer: Medicare HMO | Admitting: Psychiatry

## 2022-11-03 DIAGNOSIS — F411 Generalized anxiety disorder: Secondary | ICD-10-CM | POA: Diagnosis not present

## 2022-11-03 NOTE — Progress Notes (Signed)
[facility] Counselor/Therapist Progress Note  Patient ID: Victoria Adkins, MRN: [id number],    Date: 11/03/2022  Time Spent: 55 minutes   Treatment Type: Individual Therapy  Reported Symptoms:  anxiety, frustration, depression  Mental Status Exam:  Appearance:   Neat     Behavior:  Appropriate, Sharing, and Motivated  Motor:  Normal  Speech/Language:   Clear and Coherent  Affect:  Depressed and anxious  Mood:  anxious and depressed  Thought process:  goal directed  Thought content:    Rumination  Sensory/Perceptual disturbances:    WNL  Orientation:  oriented to person, place, time/date, situation, day of week, month of year, year, and stated date of Feb. 15, 2024  Attention:  Good  Concentration:  Good  Memory:  WNL  Fund of knowledge:   Good  Insight:    Fair  Judgment:   Good and Fair  Impulse Control:  Good and Fair   Risk Assessment: Danger to Self:  No Self-injurious Behavior: No Danger to Others: No Duty to Warn:no Physical Aggression / Violence:No  Access to Firearms a concern: No  Gang Involvement:No   Subjective:   Patient in today reporting anxiety, frustration, and depression. Quit her part-time job with [employer] as "I just needed a break and they were making some changes". Relationship concerns between patient and adult daughter (35) continue however patient was asked by daughter to ride with daughter to [place] to get form needed for a flight to [place] soon. Further discussed more concerns patient is having about their relationship which has been more troubled in recent years. Processed a lot of hurt as a result of their stressful relationship. Anger and hurt coexist for patient and she is putting forth effort to work on both, very gradually as discussed in session today. Tendency to jump to conclusions at times and encourage her to think through her feelings more and not always say "what she feels like saying" out of anger and frustration. Doesn't really  want to sabotage relationship which seems to be fragile per patient report.  Some increased patience and trying to think through her actions and reactions. No tearfulness today. Still working on her communication skills and sound decision-making.    Interventions: Cognitive Behavioral Therapy and Ego-Supportive  Long term goal: Reduce overall level, frequency, and intensity of the anxiety so that daily functioning is not impaired.  "Work on not assuming the worst until it is proven different". Short term goal: Verbalize an  understanding of how thoughts, physical feelings, and behavioral actions contribute to anxiety and its treatment.  Work with behavioral changes that can help alleviate anxiety. Strategies: Complete anxiety homework exercises that identify cognitive distortions that generate anxious feelings.  Diagnosis:   ICD-10-CM   1. Generalized anxiety disorder  F41.1      Plan:  Patient actively involved in session today as she worked on her anxiety, frustration, and depression re: work changes and complicated relationship with adult daughter.  Worked well today on her job related issues and especially engaging more in working on the relationship with her daughter, understanding she cannot change her daughter and can only change herself, although and changing herself her daughter may also make some changes which could impact the relationship in a positive way.  Still easy for patient to jump to conclusions when she is talking about situations however today it did not seem to happen as often in her session and I commented on this to her, as I  see that may be some growth on her part.  Is making progress and needs to continue working with goal-directed behaviors in order to continue moving forward in a healthier direction. Encouraged patient in her practice of more self affirming and positive behaviors as discussed in session including: Staying in the present and focusing on what she can  actually change her control, understand that some of her anxious thoughts are "just thoughts" and are not necessarily going to play out in reality, get outside some each day, look for more positives versus negatives daily, remain on her prescribed medication, refrain from assuming negatives and worst-case scenarios, healthy nutrition and exercise, practice more positive self talk, refrain from self negating, practice more intentional listening to others that are helpful to her, reduce her overthinking and over analyzing, letting go of things from the past that hold her back now, be willing to work with some of the rigid ways of thinking that she has had over the years and decrease them, and recognize the strengths she shows working with goal-directed behaviors to move in a direction that supports her improved emotional health.  Goal review and progress/challenges noted with patient.  Next appointment within 2 to 3 weeks.  This record has been created using [employer].  Chart creation errors have been sought, but may not always have been located and corrected.  Such creation errors do not reflect on the standard of medical care provided.   Shanon Ace, LCSW

## 2022-11-17 ENCOUNTER — Encounter: Payer: Self-pay | Admitting: Radiology

## 2022-11-23 ENCOUNTER — Ambulatory Visit: Payer: Medicare HMO | Admitting: Psychiatry

## 2022-11-23 DIAGNOSIS — F411 Generalized anxiety disorder: Secondary | ICD-10-CM

## 2022-11-23 NOTE — Progress Notes (Signed)
[facility] Counselor/Therapist Progress Note  Patient ID: Victoria Adkins, MRN: [id number],    Date: 11/23/2022  Time Spent: 50 minutes   Treatment Type: Individual Therapy  Reported Symptoms: anxiety, depression, some hurt and sadness, crying "but I had been better'   Mental Status Exam:  Appearance:   Casual and Neat     Behavior:  Appropriate, Sharing, and Motivated  Motor:  Normal  Speech/Language:   Clear and Coherent  Affect:  Depressed and anxious, sad  Mood:  anxious, depressed, and sad  Thought process:  goal directed  Thought content:    Rumination  Sensory/Perceptual disturbances:    WNL  Orientation:  oriented to person, place, time/date, situation, day of week, month of year, year, and stated date of November 22, 2021  Attention:  Good  Concentration:  Good and Fair  Memory:  Reporting some short term memory issues  Fund of knowledge:   Good  Insight:    Good and Fair  Judgment:   Good  Impulse Control:  Good   Risk Assessment: Danger to Self:  No Self-injurious Behavior: No Danger to Others: No Duty to Warn:no Physical Aggression / Violence:No  Access to Firearms a concern: No  Gang Involvement:No   Subjective: Patient in for session today and reports anxiety, depression, hurt, with some crying episodes when sad. Did well  today in naming and processing several issues that are feeding her hurt, depression, and anxiety. Looking not only at what she feels are her weaknesses but also able to start identifying some strengths. Talked through some concerns that are more sad and depressive and able to do this more thoroughly. Realizing she is needing more personal contact with others and helped her discern what might be some good options for her in meeting more people. Also today worked some in session on some anger and hurt which continue to coexist for patient and she is trying to confront more directly. Concerned about aging over the next few years wants to talk  about this at some other time as we were running close sometime today.  Relationship with her daughter does not seem to be quite as distanced, and patient acknowledges that she has not "pushed for information" daughter and is trying to be nonjudgmental, and that may be helping some.  Also reports some noticeable "forgetting at times" and plans to discuss this with her PCP.  Noticeably trying to think through her actions and behaviors more as she interacts with others.  (Part of what we discussed at last session regarding communication).  Interventions: Cognitive Behavioral Therapy and Ego-Supportive  Long term goal: Reduce overall level, frequency, and intensity of the anxiety so that daily functioning is not impaired.  "Work on not assuming the worst until it is proven different". Short term goal: Verbalize an  understanding of how thoughts, physical feelings, and behavioral actions contribute to anxiety and its treatment.  Work with behavioral changes that can help alleviate anxiety. Strategies: Complete anxiety homework exercises that identify cognitive distortions that generate anxious feelings.   Diagnosis:   ICD-10-CM   1. Generalized anxiety disorder  F41.1      Plan:  Patient today showing good motivation and participation in session as she focused on her anxiety, depression, and interpersonal hurt involving friends and adult daughter.  Relationship with adult daughter is going a little better and that is encouraging.  Trying to remember from a prior therapy session that she can only change herself and cannot change  others.  Is being more assertive and letting friend know when friend says something that is hurtful.  Is definitely making progress and needs to continue working with goal-directed behaviors to move in a healthier and positive direction. Encourage patient and practicing more positive and self affirming behaviors as noted in session including: Understanding that some of her anxious  thoughts are "just thoughts" and are not necessarily going to play out in reality, remain in the present and focused on what she can actually change or control, get outside some each day, look for more positives versus negatives each day, stay on prescribed medication, refrain from assuming negatives and worst-case scenarios, healthy nutrition and exercise, practice more positive self talk, refrain from self negating, practice more intentional listening to others that are helpful to her, reduce her overthinking and over analyzing, letting go of things from the past that hold her back now, be willing to work on some of the rigid ways of thinking that she has had over the years that can get in her way of wanting to move forward currently, and realize the strength she shows working with goal-directed behaviors to move in a direction that supports her improved emotional health and overall wellbeing.  Goal review and progress/challenges noted with patient.  Next appointment within 2 to 3 weeks.  This record has been created using [employer].  Chart creation errors have been sought, but may not always have been located and corrected.  Such creation errors do not reflect on the standard of medical care provided.   Shanon Ace, LCSW

## 2022-12-05 ENCOUNTER — Ambulatory Visit: Payer: Medicare HMO | Admitting: Psychiatry

## 2022-12-05 DIAGNOSIS — F411 Generalized anxiety disorder: Secondary | ICD-10-CM | POA: Diagnosis not present

## 2022-12-05 NOTE — Progress Notes (Signed)
[facility] Counselor/Therapist Progress Note  Patient ID: Victoria Adkins, MRN: [id number],    Date: 12/05/2022  Time Spent: 50 minutes   Treatment Type: Individual Therapy  Reported Symptoms: anxiety   Mental Status Exam:  Appearance:   Well Groomed     Behavior:  Appropriate, Sharing, and Motivated  Motor:  Normal  Speech/Language:   Clear and Coherent  Affect:  anxious  Mood:  anxious  Thought process:  goal directed  Thought content:    WNL  Sensory/Perceptual disturbances:    WNL  Orientation:  oriented to person, place, time/date, situation, day of week, month of year, year, and stated date of December 05, 2022  Attention:  Fair  Concentration:  Good and Fair  Memory:  Some short term memory issues and Dr is aware  Fund of knowledge:   Good  Insight:    Good and Fair  Judgment:   Good  Impulse Control:  Fair   Risk Assessment: Danger to Self:  No Self-injurious Behavior: No Danger to Others: No Duty to Warn:no Physical Aggression / Violence:No  Access to Firearms a concern: No  Gang Involvement:No   Subjective:  Patient in today reporting anxiety as primary symptom. Less depression, hurt and crying. Able to identify her strengths also including: not always blurting out what I'm thinking, need to think before I speak, not assuming as many worst-case scenarios (and that is a work in progress).  Had a much healthier conversation recently with adult daughter for almost 2 hours this past week while watching a basketball game. Processed this in session today and states she has been making some changes in her behavior since starting therapy and her daughter has noticed this and has begun talking with patient more. Patient hoping that this will be a turn-around in her relationship with daughter. Shared some other friendship issues relating to other friends and use part of her time today to problem solve those issues and focus more on setting appropriate boundaries with others  as well as giving appropriate feedback when needed.  Remains concerned about aging "but not wanting to talk about it just yet".  Smiling more today and says it is because she has noticed some improvement in relationship with her adult daughter.  Very grateful for this and adds that she is still trying to pay more attention to her words and behaviors and interactions with others so as to have healthier relationships, and is realizing more of how her behavior and verbal communication is a part of that.  Interventions: Cognitive Behavioral Therapy and Ego-Supportive  Long term goal: Reduce overall level, frequency, and intensity of the anxiety so that daily functioning is not impaired.  "Work on not assuming the worst until it is proven different". Short term goal: Verbalize an  understanding of how thoughts, physical feelings, and behavioral actions contribute to anxiety and its treatment.  Work with behavioral changes that can help alleviate anxiety. Strategies: Complete anxiety homework exercises that identify cognitive distortions that generate anxious feelings  Diagnosis:   ICD-10-CM   1. Generalized anxiety disorder  F41.1      Plan:  Patient in today with good motivation and active participation in session as she worked further on her anxiety, her decreasing depression, past hurts, and particularly her relationship with her adult daughter which has shown some improvement recently.  States she is trying not to "assume worst-case scenarios as much", not jump to conclusions with other people, to be available and work  on relationship with her adult daughter, to recognize what behaviors she needs to continue working on including communication skills, and be patient with herself and others.  More motivated today and this shows in her participation in session.  Showing more assertiveness appropriately, and making progress.  Needs to continue working with goal-directed behaviors as she moves in a forward  direction. Encouraged patient in her practice of more positive and self affirming behaviors as noted in session including: Understanding that some of her anxious thoughts are "just thoughts" and are not necessarily going to play out in reality, getting outside some each day, looking for more positives versus negatives each day, remain on her prescribed medication, stay in the present and focused on what she can change or control, refrain from assuming negatives and worst-case scenarios, healthy nutrition and exercise, practice more positive self talk, refrain from self negating, practice more intentional listening to others that are helpful to her, reduce her overthinking and over analyzing, letting go of things from the past that still hold her back now, be willing to work on some of the rigid ways of thinking that she has had over the years and that get in her way of wanting to move forward now, and recognize the strength she shows working with goal-directed behaviors to move in a direction that supports her improved emotional health and overall outlook.  Review and progress/challenges noted with patient.  Next appointment within 3 weeks.  This record has been created using [employer].  Chart creation errors have been sought, but may not always have been located and corrected.  Such creation errors do not reflect on the standard of medical care provided.   Shanon Ace, LCSW

## 2022-12-19 ENCOUNTER — Ambulatory Visit (INDEPENDENT_AMBULATORY_CARE_PROVIDER_SITE_OTHER): Payer: Medicare HMO | Admitting: Psychiatry

## 2022-12-19 DIAGNOSIS — F411 Generalized anxiety disorder: Secondary | ICD-10-CM

## 2022-12-19 NOTE — Progress Notes (Signed)
[facility] Counselor/Therapist Progress Note  Patient ID: Victoria Adkins, MRN: [id number],    Date: 12/19/2022  Time Spent: 55 minutes   Treatment Type: Individual Therapy  Reported Symptoms: depression, anxiety, sadness  Mental Status Exam:  Appearance:   Casual     Behavior:  Appropriate, Sharing, and Motivated  Motor:  Normal  Speech/Language:   Negative  Affect:  Depressed and anxious  Mood:  anxious, depressed, and sad  Thought process:  goal directed  Thought content:    Obsessive thoughts, ruminating  Sensory/Perceptual disturbances:    WNL  Orientation:  oriented to person, place, time/date, situation, day of week, month of year, year, and stated date of December 19, 2022  Attention:  Fair  Concentration:  Fair  Memory:  Reporting some memory issues and had some testing done last week but no results yet  Fund of knowledge:   Good and Fair  Insight:    Good and Fair  Judgment:   Good  Impulse Control:  Good and Fair   Risk Assessment: Danger to Self:  No Self-injurious Behavior: No Danger to Others: No Duty to Warn:no Physical Aggression / Violence:No  Access to Firearms a concern: No  Gang Involvement:No   Subjective:  Patient today reporting anxiety, depression, some sadness, and worries about some memory issues. Is to be contacted about returning to get results from memory testing. Shares some tension with adult daughter more recently, which has been hurtful. Some challenges in relationship with daughter that are hurtful and patient processed these challenges today. Identifies her hurts and her strengths. Making efforts to think before she speaks and not just blurt out her thoughts, and refrain from imagining worst-case scenarios. Saw PCP last week re: bladder issues and some med issues. Continues trying more helpful behaviors in relation to her daughter, not rushing to judgement, being more patient with herself and others. Continues working on more appropriate  boundaries. Friendships are stressful. Trying to keep healthier boundaries with friends and focusing more on positive behaviors. Concerned about aging "more in general rather than specifics". Thankful for her good friends, even with occasional differences. Focused more on her communication, "trying to stop cussing so much in conversations." Also trying to be less impulsive in word and action, and showing some improvement which she shared today. Still wanting to strengthen some relationships and knows that her communication is a factor in her relating to people.   Interventions: Cognitive Behavioral Therapy  Long term goal: Reduce overall level, frequency, and intensity of the anxiety so that daily functioning is not impaired.  "Work on not assuming the worst until it is proven different". Short term goal: Verbalize an  understanding of how thoughts, physical feelings, and behavioral actions contribute to anxiety and its treatment.  Work with behavioral changes that can help alleviate anxiety. Strategies: Complete anxiety homework exercises that identify cognitive distortions that generate anxious feelings  Diagnosis:   ICD-10-CM   1. Generalized anxiety disorder  F41.1      Plan:   Patient in today showing good motivation and participation in session working on her anxiety, sadness, depression, and worrying about some potential memory issues.  As noted above, she has had some memory testing and is to get results this week per her report and she will be going in for an appointment to get those.  Is continuing to work on her treatment goals and making some progress.  Needs to continue working with goal-directed behaviors in order to keep  moving into positive direction. Encouraged patient in practicing more positive and self affirming behaviors as noted in session including: Better understand that some of her anxious thoughts are "just thoughts" and are not necessarily going to play out in reality,  looking for more positives versus negatives each day, stay on her prescribed medication, stay in the present and focused on what she can change or control, refrain from assuming negatives and worst-case scenarios, healthy nutrition and exercise, positive self talk, practice more intentional listening to others that are helpful to her, reduce overthinking and over analyzing, letting go of things from the past that still hold her back now, be willing to work on some of the rigid ways of thinking that she has had over the years that can get in the way of her moving forward currently, and realize the strength she shows working with goal-directed behaviors to move in a direction that supports her improved emotional health and wellbeing.  Goal review and progress/challenges noted with patient.  Next appointment within 2-3 weeks.  This record has been created using [employer].  Chart creation errors have been sought, but may not always have been located and corrected.  Such creation errors do not reflect on the standard of medical care provided.   Shanon Ace, LCSW

## 2023-01-02 ENCOUNTER — Ambulatory Visit: Payer: Medicare HMO | Admitting: Psychiatry

## 2023-01-02 DIAGNOSIS — F411 Generalized anxiety disorder: Secondary | ICD-10-CM | POA: Diagnosis not present

## 2023-01-02 NOTE — Progress Notes (Signed)
[facility] Counselor/Therapist Progress Note  Patient ID: Victoria Adkins, MRN: [id number],    Date: 01/02/2023  Time Spent: 55 minutes   Treatment Type: Individual Therapy  Reported Symptoms: anxiety, depressed, sadness, anger, frustration  Mental Status Exam:  Appearance:   Neat     Behavior:  Appropriate, Sharing, and Motivated  Motor:  Normal  Speech/Language:   Clear and Coherent  Affect:  Depressed and anxious  Mood:  anxious and depressed  Thought process:  goal directed  Thought content:    Rumination  Sensory/Perceptual disturbances:    WNL  Orientation:  oriented to person, place, time/date, situation, day of week, month of year, year, and stated date of January 02, 2023  Attention:  Good  Concentration:  Good and Fair  Memory:  WNL  Fund of knowledge:   Good  Insight:    Fair  Judgment:   Fair  Impulse Control:  Fair   Risk Assessment: Danger to Self:  No Self-injurious Behavior: No Danger to Others: No Duty to Warn:no Physical Aggression / Violence:No  Access to Firearms a concern: No  Gang Involvement:No   Subjective:   Patient in today reporting anxiety, depression, sadness, anger, and frustration. Hurt again by adult daughter's behaviors and distancing. Needed session today to voice and process her anxious, sad, and angry thoughts mostly related to her relationship with her adult daughter and some of the recent behaviors and communication involved that has been difficulty to manage. Continued friction in relationship with daughter. Today shared and worked through more of her hurt, disappointment, and sadness. Not presenting as worried today about her recent memory issues and consult. She is to have a follow-up appt about that later this week and shared that a long time friend is going with her to appt. Trying to think more before she talks. Relationships (some) remain stressful. Especially finding the communication with adult daughter due of daughter's  actions and words. Wants to be able to focus more on positives vs negatives and discussed this as session ended, and will pick up next session.   Interventions: Cognitive Behavioral Therapy and Ego-Supportive  Long term goal: Reduce overall level, frequency, and intensity of the anxiety so that daily functioning is not impaired.  "Work on not assuming the worst until it is proven different". Short term goal: Verbalize an  understanding of how thoughts, physical feelings, and behavioral actions contribute to anxiety and its treatment.  Work with behavioral changes that can help alleviate anxiety. Strategies: Complete anxiety homework exercises that identify cognitive distortions that generate anxious feelings  Diagnosis:   ICD-10-CM   1. Generalized anxiety disorder  F41.1      Plan:   Patient today showing good participation and motivation as she worked in session on her anxiety, depression, hurt, sadness, some anger and frustration.  Hurt mostly related to relationship with adult family member.  Is making progress in her motivation to work on tough issues in her life and showing some progress.  Needs to continue working with goal-directed behaviors in order to move in a forward direction. Encouraged patient and her practice of more positive and self affirming behaviors as noted in session including: Better understand that some of her anxious thoughts are "just thoughts" and are not necessarily going to play out in reality the way she imagines, looking for more positives versus negatives daily, remain on her prescribed medication, stay in the present and focused on what she can change or control, refrain from assuming  negatives and worst-case scenarios, positive self talk, practice more intentional listening to others that are helpful to her, reduce overthinking and over analyzing, letting go of things from her past that still hold her back now, be willing to work on some of the rigid ways of  thinking that she has had over the years that can get in the way of her moving forward currently, and recognize the strength she shows working with goal-directed behaviors to move in a direction that supports her improved emotional health and outlook.  Goal review and progress/challenges noted with patient.  Next appointment within 3 weeks.  This record has been created using [employer].  Chart creation errors have been sought, but may not always have been located and corrected.  Such creation errors do not reflect on the standard of medical care provided.   Mathis Fare, LCSW

## 2023-01-16 ENCOUNTER — Ambulatory Visit: Payer: Medicare HMO | Admitting: Psychiatry

## 2023-01-16 DIAGNOSIS — F411 Generalized anxiety disorder: Secondary | ICD-10-CM

## 2023-01-16 NOTE — Progress Notes (Signed)
[facility] Counselor/Therapist Progress Note  Patient ID: Victoria Adkins, MRN: [id number],    Date: 01/16/2023  Time Spent: 55 minutes  Treatment Type: Individual Therapy  Reported Symptoms: anxiety, depression, sadness, some anger and frustration "but not as bad as at last appt   Mental Status Exam:  Appearance:   Casual and Neat     Behavior:  Appropriate, Sharing, and Motivated  Motor:  Normal  Speech/Language:   Clear and Coherent  Affect:  anxious , depression, sad  Mood:  anxious, depression, sad  Thought process:  goal directed  Thought content:    Rumination  Sensory/Perceptual disturbances:    WNL  Orientation:  oriented to person, place, time/date, situation, day of week, month of year, year, and stated date of January 16, 2023  Attention:  Good  Concentration:  Good and Fair  Memory:  Some memory issues and had recent testing --to bring copy of report next session  Fund of knowledge:   Good and Fair  Insight:    Good and Fair  Judgment:   Good  Impulse Control:  Good and Fair   Risk Assessment: Danger to Self:  No Self-injurious Behavior: No Danger to Others: No Duty to Warn:no Physical Aggression / Violence:No  Access to Firearms a concern: No  Gang Involvement:No   Subjective:  Patient in session today and reporting anxiety, depression, some anger, and some sadness, with some ongoing conflict and interpersonal issues with daughter who has been hurtful to patient verbally and behaviorally. Continues to have difficulty in relation to her adult daughter who can be very hurtful towards patient and "I need to work on letting things go". Processed her thoughts and emotions from recent contacts with her adult daughter. Some tearfulness in talking about relationships in past with daughter and how hurtful some encounters have been. Long history of difficulty in their relationship but patient was hoping for some improvement "but not seeing any improvement yet." Shared  how sad an disappointing this is for patient. Wants to have healthier limits but fears at times it will make it worse and struggles at times with her anger and making assumptions, which she shared and talked through today, as she continues working through the friction, sadness, disappointment and pain in relationship with daughter.  Patient reports that she is continuing to work on "thinking before I speak" and showing some improvement.  Communication with adult daughter continues to be very challenging.  Interventions: Cognitive Behavioral Therapy and Ego-Supportive  Long term goal: Reduce overall level, frequency, and intensity of the anxiety so that daily functioning is not impaired.  "Work on not assuming the worst until it is proven different". Short term goal: Verbalize an  understanding of how thoughts, physical feelings, and behavioral actions contribute to anxiety and its treatment.  Work with behavioral changes that can help alleviate anxiety. Strategies: Complete anxiety homework exercises that identify cognitive distortions that generate anxious feelings  Diagnosis:   ICD-10-CM   1. Generalized anxiety disorder  F41.1      Plan:   Patient actively participating in session today working on her anxiety, depression, sadness, some anger, and ongoing conflictual relationship with adult daughter that has been very hurtful to her over the years.  Patient is making progress and needs to continue working through goal-directed behaviors to keep moving in a forward direction. Encouraged patient and practicing more positive and self affirming behaviors as noted in session including: Understand that her anxious thoughts are often "just thoughts"  and not necessarily going to play out in reality the way she imagines, look for more positives versus negatives daily, stay on her prescribed medication, remain in the present and focused on what she can change or control, refrain from assuming negatives and  worst-case scenarios, positive self talk, practice more intentional listening to others that are helpful to her, reduce overthinking and over analyzing, letting go of things from her past that still hold her back now, be willing to work on some of the rigid ways of thinking that she has had over the years that can get in the way of her moving forward and healing emotionally, and recognize the strengths she shows working with goal-directed behaviors to move in a direction that supports her improved emotional health and outlook.  Goal review and progress/challenges noted with patient.  Next appointment within 2 to 3 weeks.   Mathis Fare, LCSW

## 2023-01-31 ENCOUNTER — Ambulatory Visit (INDEPENDENT_AMBULATORY_CARE_PROVIDER_SITE_OTHER): Payer: Self-pay | Admitting: Psychiatry

## 2023-01-31 DIAGNOSIS — F411 Generalized anxiety disorder: Secondary | ICD-10-CM

## 2023-01-31 NOTE — Progress Notes (Signed)
Patient called today and spoke with MS in front office.  She canceled her appointment today with no reason given.

## 2023-02-14 ENCOUNTER — Ambulatory Visit: Payer: Medicare HMO | Admitting: Psychiatry

## 2023-02-27 ENCOUNTER — Ambulatory Visit: Payer: Medicare HMO | Attending: Cardiology | Admitting: Cardiology

## 2023-02-27 ENCOUNTER — Encounter: Payer: Self-pay | Admitting: Cardiology

## 2023-02-27 VITALS — BP 138/78 | HR 67 | Ht 66.0 in | Wt 210.0 lb

## 2023-02-27 DIAGNOSIS — E782 Mixed hyperlipidemia: Secondary | ICD-10-CM

## 2023-02-27 DIAGNOSIS — Z87898 Personal history of other specified conditions: Secondary | ICD-10-CM | POA: Diagnosis not present

## 2023-02-27 NOTE — Patient Instructions (Addendum)

## 2023-02-27 NOTE — Progress Notes (Signed)
    Cardiology Office Note  Date: 02/27/2023   ID: HUDA PETREY, DOB October 16, 1950, MRN [id number]  History of Present Illness: Victoria Adkins is a 72 y.o. female last seen in December 2023.  She is here for a follow-up visit.  She does not report any syncope in the interim, no specific sense of palpitations or chest pain.  I reviewed her medications.  She remains on low-dose midodrine.  ECG today shows sinus rhythm with PVC.  She continues to follow at [facility] and is due for a wellness check this summer.  I reviewed her lab work from March.  Physical Exam: VS:  BP 138/78   Pulse 67   Ht 5\' 6"  (1.676 m)   Wt 210 lb (95.3 kg)   SpO2 94%   BMI 33.89 kg/m , BMI Body mass index is 33.89 kg/m.  Wt Readings from Last 3 Encounters:  02/27/23 210 lb (95.3 kg)  08/22/22 208 lb (94.3 kg)  05/12/22 208 lb 12.8 oz (94.7 kg)    General: Patient appears comfortable at rest. HEENT: Conjunctiva and lids normal. Neck: Supple, no elevated JVP or carotid bruits. Lungs: Clear to auscultation, nonlabored breathing at rest. Cardiac: Regular rate and rhythm, no S3 or significant systolic murmur.  ECG:  An ECG dated 03/16/2021 was personally reviewed today and demonstrated:  Sinus rhythm.  Labwork: August 2023: Cholesterol 159, triglycerides 168, HDL 54, LDL 77 March 2024: Hemoglobin 12.3, BUN 14, creatinine 0.89, potassium 3.8, AST 46, ALT 44, TSH 4.27  Other Studies Reviewed Today:  Echocardiogram 05/17/2022: 1. Left ventricular ejection fraction, by estimation, is 50%. The left  ventricle has low normal function. The left ventricle has no regional wall  motion abnormalities. Left ventricular diastolic parameters are consistent  with Grade I diastolic  dysfunction (impaired relaxation). The average left ventricular global  longitudinal strain is -18.1 %. The global longitudinal strain is normal.   2. Right ventricular systolic function is normal. The right ventricular  size is  normal. Tricuspid regurgitation signal is inadequate for assessing  PA pressure.   3. The mitral valve is normal in structure. No evidence of mitral valve  regurgitation. No evidence of mitral stenosis.   4. The aortic valve is tricuspid. There is mild calcification of the  aortic valve. There is mild thickening of the aortic valve. Aortic valve  regurgitation is mild to moderate.   5. The inferior vena cava is normal in size with greater than 50%  respiratory variability, suggesting right atrial pressure of 3 mmHg.   Assessment and Plan:  1.  History of syncope.  Potentially neurocardiogenic based on workup so far.  She had no ischemia by Myoview last year, LVEF approximately 50% by echocardiogram and RV contraction normal.  Cardiac monitor showed frequent PVCs at approximately 6% but no sustained arrhythmias or pauses.  She has had some symptomatic improvement on midodrine.  She has had no interval events and we will continue with observation for now.  2.  Mixed hyperlipidemia.  Currently on Lipitor with follow-up by Dr. Sherril Croon.  Disposition:  Follow up  6 months.  Signed, Jonelle Sidle, M.D., F.A.C.C. [facility]

## 2023-02-28 ENCOUNTER — Ambulatory Visit: Payer: Medicare HMO | Admitting: Cardiology

## 2023-03-30 ENCOUNTER — Ambulatory Visit: Payer: Medicare HMO | Admitting: Psychiatry

## 2023-04-27 ENCOUNTER — Ambulatory Visit: Payer: Medicare HMO | Admitting: Psychiatry

## 2023-05-24 ENCOUNTER — Other Ambulatory Visit: Payer: Self-pay | Admitting: Internal Medicine

## 2023-05-24 DIAGNOSIS — Z1231 Encounter for screening mammogram for malignant neoplasm of breast: Secondary | ICD-10-CM

## 2023-05-31 ENCOUNTER — Ambulatory Visit: Payer: Medicare HMO | Admitting: Psychiatry

## 2023-06-06 ENCOUNTER — Ambulatory Visit: Payer: Medicare HMO | Admitting: Psychiatry

## 2023-06-06 DIAGNOSIS — F411 Generalized anxiety disorder: Secondary | ICD-10-CM

## 2023-06-06 NOTE — Progress Notes (Signed)
[facility] Counselor/Therapist Progress Note  Patient ID: Victoria Adkins, MRN: [id number],    Date: 06/06/2023  Time Spent: 50 minutes   Treatment Type: Individual Therapy  Reported Symptoms:  anxiety, some depression   Mental Status Exam:  Appearance:   Casual and Neat     Behavior:  Appropriate, Sharing, and Motivated  Motor:  Normal  Speech/Language:   Clear and Coherent  Affect:  anxious  Mood:  anxious  Thought process:  goal directed  Thought content:    WNL  Sensory/Perceptual disturbances:    WNL  Orientation:  oriented to person, place, time/date, situation, day of week, month of year, year, and stated date of Sept. 17, 2024  Attention:  Fair  Concentration:  Fair  Memory:  Some short term issues and Dr is aware  Fund of knowledge:   Good  Insight:    Good  Judgment:   Good  Impulse Control:  Fair   Risk Assessment: Danger to Self:  No Self-injurious Behavior: No Danger to Others: No Duty to Warn:no Physical Aggression / Violence:No  Access to Firearms a concern: No  Gang Involvement:No   Subjective:   Patient in for session today and reports anxiety and some depression but feels she is making progress. "I've been less bothered by relationship with my daughter, and I'm not letting it bother me in terms of our relationship." Has not heard from daughter since early June. Patient moved from her home to a condo and is really liking it. Has set boundary with adult daughter and patient did not tell daughter about her moving to a condo. Feeling much less conflict although clearly things about her relationship with daughter over the years. Brief intermittent tearfulness a couple times in session today. 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 Brief intermittent tearfulness once during session. History includes difficult times with daughter over the years and states today that she is feeling more peace gradually. Some mixed  feelings that she is still working through as noted in session today.   Interventions: Cognitive Behavioral Therapy and Ego-Supportive  Long term goal: Reduce overall level, frequency, and intensity of the anxiety so that daily functioning is not impaired.  "Work on not assuming the worst until it is proven different". Short term goal: Verbalize an  understanding of how thoughts, physical feelings, and behavioral actions contribute to anxiety and its treatment.  Work with behavioral changes that can help alleviate anxiety. Strategies: Complete anxiety homework exercises that identify cognitive distortions that generate anxious feelings   Diagnosis:   ICD-10-CM   1. Generalized anxiety disorder  F41.1      Plan:  Patient today showing increased self-confidence, less anger, and less depression after having made significant move as stated above. I liking her new condo and feeling more at peace with some family issues and knows "it will take me some time."  Stated that she needs to continue working on some relationship issues and and some unresolved feelings regarding adult daughter.  She has made progress previously and needs to continue working with goal-directed behaviors to move in a positive direction. Reminded and encouraged patient in her practice of more positive and self affirming behaviors as noted in session including: Better understand that her anxious thoughts are often "just thoughts" and not necessarily going to play out in reality the way she imagines, look for more positives daily, stay on her prescribed medication, stay in the present  and focused on what she can change or control, refrain from assuming negatives and worst-case scenarios, use of positive self talk, practice more intentional listening to others that are helpful to her, reduce overthinking and over analyzing, letting go of things from her past that still hold her back now, be willing to work on some of the rigid ways of  thinking that she has had over the years and can get in her way of moving forward in healing emotionally, and realize the strength she shows working with goal-directed behaviors to move in a direction that supports her improved emotional health and overall wellbeing.  Goal review and progress/challenges noted with patient.  Next appointment within 3 weeks.   Mathis Fare, LCSW

## 2023-06-27 ENCOUNTER — Inpatient Hospital Stay: Admission: RE | Admit: 2023-06-27 | Payer: Medicare HMO | Source: Ambulatory Visit

## 2023-06-27 ENCOUNTER — Ambulatory Visit
Admission: RE | Admit: 2023-06-27 | Discharge: 2023-06-27 | Disposition: A | Discharge status: Home / Self Care | Payer: Medicare HMO | Source: Ambulatory Visit | Attending: Internal Medicine | Admitting: Internal Medicine

## 2023-06-27 DIAGNOSIS — Z1231 Encounter for screening mammogram for malignant neoplasm of breast: Secondary | ICD-10-CM

## 2023-06-28 ENCOUNTER — Ambulatory Visit: Payer: Medicare HMO | Admitting: Psychiatry

## 2023-06-28 DIAGNOSIS — F411 Generalized anxiety disorder: Secondary | ICD-10-CM

## 2023-06-28 NOTE — Progress Notes (Signed)
[facility] Counselor/Therapist Progress Note  Patient ID: Victoria Adkins, MRN: [id number],    Date: 06/28/2023  Time Spent: 50 minutes   Treatment Type: Individual Therapy  Reported Symptoms: anxiety, some depression   Mental Status Exam:  Appearance:   Well Groomed     Behavior:  Appropriate, Sharing, and Motivated  Motor:  Normal  Speech/Language:   Clear and Coherent  Affect:  Depressed and anxious  Mood:  anxious and depressed  Thought process:  goal directed  Thought content:    WNL  Sensory/Perceptual disturbances:    WNL  Orientation:  oriented to person, place, time/date, situation, day of week, month of year, year, and stated date of Oct. 9, 2024  Attention:  Fair  Concentration:  Fair  Memory:  Some short term memory issues and Dr is aware  Fund of knowledge:   Good and Fair  Insight:    Good and Fair  Judgment:   Good  Impulse Control:  Fair   Risk Assessment: Danger to Self:  No Self-injurious Behavior: No Danger to Others: No Duty to Warn:no Physical Aggression / Violence:No  Access to Firearms a concern: No  Gang Involvement:No   Subjective:   Patient in session today and reporting anxiety and some depression and "things are some better". Beginning to enjoy some things after moving to a [employer], meeting neighbors, and feels safer there. Still has not heard anything from her adult daughter since June 2024. Has felt anger, sadness, frustration, unhappy, hurt. Has been and continues to work on her healing re: relationship with daughter and realize I can't change her. Is surprised that daughter has not reached out to patient at all. Patient needed session today to share and process the relationship issues with her daughter that have been very hurtful to patient. Patient did well in sharing her sadness, anger and disappointment. Plans to reach out to her daughter again soon and see if they might connect during the upcoming holidays. Able to recognize  some of her strengths more at this point and wants to build on this. Less controlled by adult daughter.  Showing more strength.  Interventions: Cognitive Behavioral Therapy and Ego-Supportive  Long term goal: Reduce overall level, frequency, and intensity of the anxiety so that daily functioning is not impaired.  "Work on not assuming the worst until it is proven different". Short term goal: Verbalize an  understanding of how thoughts, physical feelings, and behavioral actions contribute to anxiety and its treatment.  Work with behavioral changes that can help alleviate anxiety. Strategies: Complete anxiety homework exercises that identify cognitive distortions that generate anxious feelings    Diagnosis:   ICD-10-CM   1. Generalized anxiety disorder  F41.1      Plan:  Patient in today working further on her anger, depression, and self-confidence, all of which she has made some progress on.  Is enjoying her new condo and feeling some "increased peace" regarding certain family issues and also understanding, as we discussed today, that it will continue to take some time as she works to resolve past and current issues particularly within her family.  Patient is making progress and needs to continue working with goal-directed behaviors to keep moving in a positive and healthier direction. Reminded and encouraged patient to be practicing more positive and self affirming behaviors as noted in session including: Getting out more and her new condo neighborhood and meeting other people, better understand that her anxious thoughts are often "just thoughts" and  are not necessarily going to play out in reality the way she imagines, look for more positives daily, stay on her prescribed medication, stay in the present and focused on what she can change or control, refrain from assuming negatives and worst-case scenarios, use of positive self talk, practice more intentional listening to others that are helpful to  her, reduce overthinking and over analyzing, letting go of things from her past that still can hold her back now, be willing to work on some of the rigid ways of thinking that she has had over the years and can get in her way of moving forward as she heals emotionally, and recognize the strength she shows working with goal-directed behaviors to move in a direction that supports her improved emotional health and outlook into the future.  Goal review and progress/challenges noted with patient.  Next appointment within 3 to 4 weeks.   Mathis Fare, LCSW

## 2023-07-18 ENCOUNTER — Ambulatory Visit: Payer: Medicare HMO | Admitting: Psychiatry

## 2023-07-18 DIAGNOSIS — F411 Generalized anxiety disorder: Secondary | ICD-10-CM

## 2023-07-18 NOTE — Progress Notes (Signed)
[facility] Counselor/Therapist Progress Note  Patient ID: Victoria Adkins, MRN: [id number],    Date: 07/18/2023  Time Spent: 53 minutes   Treatment Type: Individual Therapy  Reported Symptoms: anxiety, some depression    Mental Status Exam:  Appearance:   Neat and Well Groomed     Behavior:  Appropriate, Sharing, and Motivated  Motor:  Normal  Speech/Language:   Clear and Coherent  Affect:  Depressed and anxious  Mood:  anxious and depressed  Thought process:  goal directed  Thought content:    Rumination  Sensory/Perceptual disturbances:    WNL  Orientation:  oriented to person, place, time/date, situation, day of week, month of year, year, and stated date of Oct. 29, 2024  Attention:  Fair  Concentration:  Fair  Memory:  Some short term memory issues and Dr is aware  Fund of knowledge:   Good  Insight:    Good and Fair  Judgment:   Good  Impulse Control:  Good and Fair   Risk Assessment: Danger to Self:  No Self-injurious Behavior: No Danger to Others: No Duty to Warn:no Physical Aggression / Violence:No  Access to Firearms a concern: No  Gang Involvement:No   Subjective:  Patient and for session today reporting anxiety re: personal, family, and especially relationship issues with adult daughter that are quite difficult for patient. Needed session today to process her hurt, sadness, anger, and frustration related to lack of relationship with daughter and feeling abandoned "without knowing why".  Talked at length today in session about her prior relationship issues with her daughter, the past that involved better and healthier memories and how things have changed so much. Some teafrulness off and on. Lots of hurt feelings shared and processed today and "trying to let go some of anger and hurt". Considering attempting contact with daughter but wants to "think things through more before doing that."  Still hurting but also having more insight into the relationship  issues with her adult daughter as discussed in session today.  Also notes that these relationship issues have played heavily into some of the emotional problems she is having and had in the past.  Does seem to be showing some additional strength today although still very hurt by the situations involving her an adult daughter.  Open to working on more ways of working through and managing her sadness and grief as discussed today.  Interventions: Cognitive Behavioral Therapy and Ego-Supportive   Long term goal: Reduce overall level, frequency, and intensity of the anxiety so that daily functioning is not impaired.  "Work on not assuming the worst until it is proven different". Short term goal: Verbalize an  understanding of how thoughts, physical feelings, and behavioral actions contribute to anxiety and its treatment.  Work with behavioral changes that can help alleviate anxiety. Strategies: Complete anxiety homework exercises that identify cognitive distortions that generate anxious feelings    Diagnosis:   ICD-10-CM   1. Generalized anxiety disorder  F41.1      Plan:    Patient in today and continuing to work on her anxiety, self-confidence, and wanting to move forward with more "increased peace" regarding family relationships currently and in the past, hoping to resolve past issues more so as to focus on a better direction ahead of her at this point.  Difficult session working on difficult issues with patient but she does seem to be improving very gradually in terms of dealing with her adult daughters lack of response or  contact with her even when she has reached out to daughter.  Patient is making progress and agrees to continue working with goal-directed behaviors to keep moving in a more positive and healthier overall direction for her. Reminded and encouraged patient in her practice of more positive and self affirming behaviors as noted in sessions including: Getting out more within her new  condo neighborhood and intentionally meeting more people, better understand that her anxious thoughts are often "just thoughts" and are not necessarily going to play out reality the way she might imagine, looking for more positives daily, stay on her prescribed medication, remain in the present and focused on what she can change her control, refrain from assuming negatives and worst-case scenarios, use of positive self talk, practice more intentional listening to others that are helpful to her, reduce overthinking and over analyzing, letting go of things from her past that still can hold her back now, be willing to work on some of the rigid ways of thinking that she has had over the years and that get in the way of her moving forward as she heals emotionally, and realize the strength she shows working with goal-directed behaviors to move in a direction that supports her improved emotional health and her overall wellbeing.  Goal review and progress/challenges noted with patient.  Next appointment within 3 to 4 weeks.   Mathis Fare, LCSW

## 2023-07-31 ENCOUNTER — Telehealth: Payer: Self-pay | Admitting: Gastroenterology

## 2023-07-31 NOTE — Telephone Encounter (Signed)
Good morning Dr. Myrtie Neither   Supervising Provider 11/11 AM   We received a call from this patient wishing to be scheduled for an appointment with Hyacinth Meeker, PA. She states she is having abdominal pain and would like to be scheduled with [facility] due to a friend of hers recommendation. Patient has GI history with [facility] in 07/2021. Records are in Epic for you to review. Would you please advise on scheduling?  Thank you.

## 2023-08-01 ENCOUNTER — Telehealth: Payer: Self-pay | Admitting: Cardiology

## 2023-08-01 ENCOUNTER — Ambulatory Visit: Payer: Medicare HMO | Admitting: Psychiatry

## 2023-08-01 NOTE — Telephone Encounter (Signed)
I do not know if we will have more to offer than the [facility] practice that saw her in 2017 or the [facility] practice that saw her in 2022, but she can be given a next available new patient appointment with Hyacinth Meeker, PA if she would like another opinion.  Ellwood Dense, MD

## 2023-08-01 NOTE — Telephone Encounter (Signed)
   Pt c/o of Chest Pain: STAT if active CP, including tightness, pressure, jaw pain, radiating pain to shoulder/upper arm/back, CP unrelieved by Nitro. Symptoms reported of SOB, nausea, vomiting, sweating.  1. Are you having CP right now?  No    2. Are you experiencing any other symptoms (ex. SOB, nausea, vomiting, sweating)?   SOB    3. Is your CP continuous or coming and going?   Comes and goes   4. Have you taken Nitroglycerin?   No    5. How long have you been experiencing CP?  2 wks    6. If NO CP at time of call then end call with telling Pt to call back or call 911 if Chest pain returns prior to return call from triage team.   Please give pt a call @ [phone]

## 2023-08-01 NOTE — Telephone Encounter (Signed)
Spoke with Liborio Nixon and gave appt to see Lenze in [place] on 08/21/2023 @1 :30 pm.  Elvis Coil to send [facility] office note and EKG from today's visit.

## 2023-08-01 NOTE — Telephone Encounter (Signed)
Dr. Sherril Croon office on the line requesting to speak with nurse about appt

## 2023-08-01 NOTE — Telephone Encounter (Signed)
02/27/23 office note by Dr.McDowell 1.  History of syncope.  Potentially neurocardiogenic based on workup so far.  She had no ischemia by Myoview last year, LVEF approximately 50% by echocardiogram and RV contraction normal.  Cardiac monitor showed frequent PVCs at approximately 6% but no sustained arrhythmias or pauses.  She has had some symptomatic improvement on midodrine.  She has had no interval events and we will continue with observation for now.        08/01/23 Left message to return call.

## 2023-08-02 ENCOUNTER — Encounter: Payer: Self-pay | Admitting: *Deleted

## 2023-08-02 NOTE — Telephone Encounter (Signed)
Called and left voicemail for patient to call back and schedule OV with Victorino Dike.

## 2023-08-02 NOTE — Telephone Encounter (Signed)
Reports intermittent chest pain for 2 weeks rated 8/10. Denies sob or dizziness. Reports she has COPD and does have SOB at times. Denies active chest pain. Gave first available to see Lenze on 08/21/2023 @1 :30 pm [place] office. Advised if she develops worsening symptoms, to go to the ED for an evaluation. Verbalized understanding of plan.

## 2023-08-03 ENCOUNTER — Encounter: Payer: Self-pay | Admitting: Physician Assistant

## 2023-08-03 ENCOUNTER — Ambulatory Visit: Payer: Medicare HMO | Admitting: Psychiatry

## 2023-08-03 DIAGNOSIS — F411 Generalized anxiety disorder: Secondary | ICD-10-CM | POA: Diagnosis not present

## 2023-08-03 NOTE — Progress Notes (Signed)
[facility] Counselor/Therapist Progress Note  Patient ID: Victoria Adkins, MRN: [id number],    Date: 08/03/2023  Time Spent: 53 minutes   Treatment Type: Individual Therapy  Reported Symptoms: anxiety, depression, frustration, hurt   Mental Status Exam:  Appearance:   Casual and Neat     Behavior:  Appropriate, Sharing, and Motivated  Motor:  Normal  Speech/Language:   Clear and Coherent  Affect:  Depressed and anxiety  Mood:  anxious and depressed  Thought process:  goal directed  Thought content:    Rumination and ruminating  especially re: new med info  Sensory/Perceptual disturbances:    WNL  Orientation:  oriented to person, place, time/date, situation, day of week, month of year, year, and stated date of Nov. 14, 2024  Attention:  Good  Concentration:  Good  Memory:  WNL  Fund of knowledge:   Good  Insight:    Fair  Judgment:   Good  Impulse Control:  Good and Fair   Risk Assessment: Danger to Self:  No Self-injurious Behavior: No Danger to Others: No Duty to Warn:no Physical Aggression / Violence:No  Access to Firearms a concern: No  Gang Involvement:No   Subjective:  Patient in session today reporting anxiety, depression, hurt, and frustration mostly related to relationship with adult daughter that has been very difficult. Patient did reach out to her daughter recently to see if she could "meet for coffee". Some "perceived tension" and tendency to assume the negative on the part of patient, which patient and therapist discussed more at length, and seemed to provide some added insight for patient.  Seeing some of her ways of jumping to conclusions that may not be accurate. Spoke at length about relationship with daughter and trying to reconnect in some way that can be meaningful (and free of conflict) for both patient and daughter. Also having some health concerns which she report she spoke with her Dr and is scheduled for CAT scan and blood work to see a  Acupuncturist. Today needed session to talk through her fears and concerns, sadness, anger, and process her hurt especially re: family issues and difficulty communicating within family especially. Continues to work on healing of some hurt in family relationships, especially in relationship to her daughter. Notes that relationship issues with daughter have been significant in the emotional problems patient has and is encountering.  Patient does seem to be showing strength today although still very hurt by some things in her past with adult daughter and working to let go of.  Showing some degree of increased hopefulness today.  Interventions: Cognitive Behavioral Therapy and Ego-Supportive  Long term goal: Reduce overall level, frequency, and intensity of the anxiety so that daily functioning is not impaired.  "Work on not assuming the worst until it is proven different". Short term goal: Verbalize an  understanding of how thoughts, physical feelings, and behavioral actions contribute to anxiety and its treatment.  Work with behavioral changes that can help alleviate anxiety. Strategies: Complete anxiety homework exercises that identify cognitive distortions that generate anxious feeling.  Diagnosis:   ICD-10-CM   1. Generalized anxiety disorder  F41.1      Plan: Patient in session today and continuing to work on issues with her adult daughter, anxiety, self-confidence, and wanting to move forward feeling more peace and calmness particularly regarding some issues in the past and hoping to resolve those issues in order to focus more on the present.  Is making progress gradually and needs  to continue her work with goal-directed behaviors to keep moving in a more positive and hopeful direction.  Reminded and encouraged patient in her practice of more positive and self affirming behaviors as noted in sessions including: Getting out more within her new condo neighborhood and intentionally trying to meet more  people, better understand that her anxious thoughts are often "just thoughts" and are not necessarily going to play out in reality the way she might imagine, look for more positives daily, stay on her prescribed medication, remain in the present and focused on what she can change or control, refrain from assuming negatives and worst-case scenarios, use of positive self talk, practice more intentional listening to others that are helpful to her, reduce overthinking and over analyzing, letting go of things from her past that still can hold her back now, be willing to work on some of the rigid ways of thinking that she has had over the years and that get in her way of moving forward as she heals emotionally, and recognize the strengths she shows working with goal-directed behaviors to move in a direction that supports her improved emotional health and outlook into the future.  Goal review and progress/challenges noted with patient.  Next appointment within 2 to 3 weeks.   Mathis Fare, LCSW

## 2023-08-07 NOTE — Progress Notes (Signed)
Cardiology Office Note:  .   Date:  08/21/2023  ID:  DAINTY LUTTRULL, DOB March 16, 1951, MRN [id number] PCP: Ignatius Specking, MD  [facility] Providers Cardiologist:  Nona Dell, MD    History of Present Illness: .   Victoria Adkins is a 72 y.o. female with history of HLD, history of chest pain normal lexi 2023,  syncope possibly neurocardiogenic.no ischemia by Myoview last year, LVEF approximately 50% by echocardiogram and RV contraction normal. Cardiac monitor showed frequent PVCs at approximately 6% but no sustained arrhythmias or pauses. She has had some symptomatic improvement on midodrine.  Patient saw Dr. Diona Browner 02/2023 doing well.  Patient added onto my scheduled for 2 weeks of intermittent chest pain, chronic DOE from COPD. She says she gets tight in her chest when she is relaxed and laying in bed. It lasts a couple of minutes. She pushes on her chest to give it relief and prays. She wonders if it's acid reflux although she doesn't experience indigestion. No radiation into neck or arms or SOB. She has chronic DOE and uses inhalers. She doesn't exercise but does her own housework and walks her dog. She doesn't have chest tightness during these activities but has to stop because she gets SOB. Is under a lot of family stress and goes to therapy twice a month. Has never been this heavy before. Ozempic didn't work and now on [employer] for a month and hasn't lost weight.   ROS:    Studies Reviewed: Marland Kitchen         Prior CV Studies:   Echocardiogram 05/17/2022: 1. Left ventricular ejection fraction, by estimation, is 50%. The left  ventricle has low normal function. The left ventricle has no regional wall  motion abnormalities. Left ventricular diastolic parameters are consistent  with Grade I diastolic  dysfunction (impaired relaxation). The average left ventricular global  longitudinal strain is -18.1 %. The global longitudinal strain is normal.   2. Right ventricular systolic function  is normal. The right ventricular  size is normal. Tricuspid regurgitation signal is inadequate for assessing  PA pressure.   3. The mitral valve is normal in structure. No evidence of mitral valve  regurgitation. No evidence of mitral stenosis.   4. The aortic valve is tricuspid. There is mild calcification of the  aortic valve. There is mild thickening of the aortic valve. Aortic valve  regurgitation is mild to moderate.   5. The inferior vena cava is normal in size with greater than 50%  respiratory variability, suggesting right atrial pressure of 3 mmHg.   NST 01/2022   The study is normal. There are no perfusion defects consistent with prior infarct or current ischemia.  The study is intermediate risk based solely on decreased LVEF, consider correlating with echocardiogram.   No ST deviation was noted.   LV perfusion is normal.   Left ventricular function is abnormal. Nuclear stress EF: 42 %. The left ventricular ejection fraction is moderately decreased (30-44%). End diastolic cavity size is normal.    Risk Assessment/Calculations:             Physical Exam:   VS:  BP (!) 98/58   Pulse 70   Ht 5\' 6"  (1.676 m)   Wt 209 lb 3.2 oz (94.9 kg)   SpO2 96%   BMI 33.77 kg/m    Wt Readings from Last 3 Encounters:  08/21/23 209 lb 3.2 oz (94.9 kg)  02/27/23 210 lb (95.3 kg)  08/22/22 208 lb (94.3  kg)    GEN: Well nourished, well developed in no acute distress NECK: No JVD; No carotid bruits CARDIAC:  RRR, no murmurs, rubs, gallops RESPIRATORY:  Clear to auscultation without rales, wheezing or rhonchi  ABDOMEN: Soft, non-tender, non-distended EXTREMITIES:  No edema; No deformity   ASSESSMENT AND PLAN: .    Chest pain only when she lays down and eases in minutes, chronic DOE relieved with inhalers(mild COPD), normal Lexi 2023. Will order cardiac PET CT   History of syncope.Potentially neurocardiogenic based on workup so far. She had no ischemia by Myoview last year, LVEF  approximately 50% by echocardiogram and RV contraction normal. Cardiac monitor showed frequent PVCs at approximately 6% but no sustained arrhythmias or pauses. She has had some symptomatic improvement on midodrine. She has had no interval events and we will continue with observation for now. BP low and no problems with edema. Will change lasix to prn  HLD LDL 102 trig 175 on lipitor  Obesity-can't lose weight on ozempic and wegovy, eats very little, TSH normal. Will refer to Dr. Fransico Him for lifestyle medicine/weight loss.  COPD on inhalers, chronic DOE       Informed Consent   Shared Decision Making/Informed Consent The risks [chest pain, shortness of breath, cardiac arrhythmias, dizziness, blood pressure fluctuations, myocardial infarction, stroke/transient ischemic attack, nausea, vomiting, allergic reaction, radiation exposure, metallic taste sensation and life-threatening complications (estimated to be 1 in 10,000)], benefits (risk stratification, diagnosing coronary artery disease, treatment guidance) and alternatives of a cardiac PET stress test were discussed in detail with Ms. Humiston and she agrees to proceed.     Dispo: f/u in 6 months or sooner if needed  Signed, Jacolyn Reedy, PA-C

## 2023-08-21 ENCOUNTER — Encounter: Payer: Self-pay | Admitting: Physician Assistant

## 2023-08-21 ENCOUNTER — Ambulatory Visit: Payer: Medicare HMO | Attending: Physician Assistant | Admitting: Physician Assistant

## 2023-08-21 VITALS — BP 98/58 | HR 70 | Ht 66.0 in | Wt 209.2 lb

## 2023-08-21 DIAGNOSIS — E782 Mixed hyperlipidemia: Secondary | ICD-10-CM | POA: Diagnosis not present

## 2023-08-21 DIAGNOSIS — J449 Chronic obstructive pulmonary disease, unspecified: Secondary | ICD-10-CM | POA: Diagnosis not present

## 2023-08-21 DIAGNOSIS — R079 Chest pain, unspecified: Secondary | ICD-10-CM

## 2023-08-21 DIAGNOSIS — Z87898 Personal history of other specified conditions: Secondary | ICD-10-CM

## 2023-08-21 MED ORDER — FUROSEMIDE 20 MG PO TABS: 20.0000 mg | ORAL_TABLET | Freq: Every day | ORAL | 2 refills | Status: DC | PRN

## 2023-08-21 NOTE — Patient Instructions (Addendum)
Medication Instructions:  Your physician has recommended you make the following change in your medication:  Change furosemide 20 mg to daily as needed for 2-3 lbs weight gain in 24 hours or swelling Continue all other medications the same  Labwork: None-bmet done 08/01/2023 with family doctor  Testing/Procedures:   Please report to Radiology at the [facility] Main Entrance 30 minutes early for your test.  [address] [place], [place] [address]                        How to Prepare for Your Cardiac PET/CT Stress Test:   Medication instructions: Do not take erectile dysfunction medications for 72 hours prior to test (sildenafil, tadalafil) Do not take nitrates (isosorbide mononitrate, Ranexa) the day before or day of test Do not take tamsulosin the day before or morning of test Hold theophylline containing medications for 12 hours. Hold Dipyridamole 48 hours prior to the test.     Diabetic Preparation: If able to eat breakfast prior to 3 hour fasting, you may take all medications, including your insulin. Do not worry if you miss your breakfast dose of insulin - start at your next meal. If you do not eat prior to 3 hour fast-Hold all diabetes (oral and insulin) medications. Patients who wear a continuous glucose monitor MUST remove the device prior to scanning.  You may take your remaining medications with water.  2. Nothing to eat or drink, except water, 3 hours prior to arrival time.   NO caffeine/decaffeinated products, or chocolate 12 hours prior to arrival. (Please note decaffeinated beverages (teas/coffees) still contain caffeine).  If you have caffeine within 12 hours prior, the test will need to be rescheduled.   3. NO perfume, cologne or lotion on chest or abdomen area. FEMALES - Please avoid wearing dresses to this appointment.  4. Total time is 1 to 2 hours; you may want to bring reading material for the waiting time.  IF YOU THINK YOU MAY BE PREGNANT,  OR ARE NURSING PLEASE INFORM THE TECHNOLOGIST.  In preparation for your appointment, medication and supplies will be purchased.  Appointment availability is limited, so if you need to cancel or reschedule, please call the Radiology Department at [phone] [facility]) OR [phone] [facility]) 24 hours in advance to avoid a cancellation fee of $100.00  What to Expect When you Arrive:  Once you arrive and check in for your appointment, you will be taken to a preparation room within the Radiology Department.  A technologist or Nurse will obtain your medical history, verify that you are correctly prepped for the exam, and explain the procedure.  Afterwards, an IV will be started in your arm and electrodes will be placed on your skin for EKG monitoring during the stress portion of the exam. Then you will be escorted to the PET/CT scanner.  There, staff will get you positioned on the scanner and obtain a blood pressure and EKG.  During the exam, you will continue to be connected to the EKG and blood pressure machines.  A small, safe amount of a radioactive tracer will be injected in your IV to obtain a series of pictures of your heart along with an injection of a stress agent.    After your Exam:  It is recommended that you eat a meal and drink a caffeinated beverage to counter act any effects of the stress agent.  Drink plenty of fluids for the remainder of the day  and urinate frequently for the first couple of hours after the exam.  Your doctor will inform you of your test results within 7-10 business days.  For more information and frequently asked questions, please visit our website: [email or web address]  For questions about your test or how to prepare for your test, please call: Cardiac Imaging Nurse Navigators Office: [phone]  Follow-Up: Your physician recommends that you schedule a follow-up appointment in: 6 months  Any Other Special Instructions Will Be Listed Below (If  Applicable). You have been referred to Dr. Fransico Him.  If you need a refill on your cardiac medications before your next appointment, please call your pharmacy.

## 2023-08-23 ENCOUNTER — Ambulatory Visit: Payer: Medicare HMO | Admitting: Psychiatry

## 2023-08-23 ENCOUNTER — Ambulatory Visit: Payer: Medicare HMO | Admitting: Nurse Practitioner

## 2023-08-28 ENCOUNTER — Other Ambulatory Visit: Payer: Self-pay | Admitting: *Deleted

## 2023-08-28 DIAGNOSIS — K8020 Calculus of gallbladder without cholecystitis without obstruction: Secondary | ICD-10-CM

## 2023-08-29 ENCOUNTER — Other Ambulatory Visit: Payer: Self-pay

## 2023-08-29 ENCOUNTER — Encounter: Payer: Self-pay | Admitting: Surgery

## 2023-08-29 ENCOUNTER — Ambulatory Visit: Payer: Medicare HMO | Admitting: Surgery

## 2023-08-29 VITALS — BP 134/80 | HR 86 | Temp 97.7°F | Resp 18 | Ht 66.0 in | Wt 209.0 lb

## 2023-08-29 DIAGNOSIS — D696 Thrombocytopenia, unspecified: Secondary | ICD-10-CM

## 2023-08-29 DIAGNOSIS — K8020 Calculus of gallbladder without cholecystitis without obstruction: Secondary | ICD-10-CM | POA: Diagnosis not present

## 2023-08-29 NOTE — Progress Notes (Signed)
[facility] History and Physical  Reason for Referral: Cholelithiasis Referring Physician: Dr. Sherril Croon  Chief Complaint   New Patient (Initial Visit)     Victoria Adkins is a 72 y.o. female.  HPI: Patient presents for evaluation of cholelithiasis.  For the last 6 to 8 months, she has had occasional right upper quadrant pain that is more underneath her breast.  She denies her pain being associated with anything in particular, including food.  She feels that the pains occur daily, but she denies it radiating to any other areas.  She denies any nausea and vomiting, and is having regular bowel movements.  She feels that rubbing underneath her breast improves her pain.  She recently followed up with her cardiologist secondary to some midline chest pain.  She was advised to get a a PET/CT to evaluate cardiac perfusion.  This has not been performed yet.  She was also noted to be thrombocytopenic with a platelet count of 79 on 08/01/2023.  Her repeat platelet count was 107 on 09/09/2023.  She is scheduled to follow-up with hematology for this finding.  The thrombocytopenia was the reason for her abdominal ultrasound, which demonstrated cholelithiasis without any signs of acute cholecystitis and hepatic steatosis.  Her past medical history significant for hyperlipidemia, hiatal hernia, anxiety/depression, and arthritis.  She denies use of blood thinning medications and denies any history of abdominal surgeries.  She will occasionally drink alcohol.  She denies use of illicit drugs and tobacco products.  Past Medical History:  Diagnosis Date   Anxiety    Arthritis    COPD (chronic obstructive pulmonary disease) (HCC)    Depression    Dyspnea    Hyperlipidemia     Past Surgical History:  Procedure Laterality Date   CATARACT EXTRACTION W/PHACO Left 04/20/2018   Procedure: CATARACT EXTRACTION PHACO AND INTRAOCULAR LENS PLACEMENT (IOC);  Surgeon: Fabio Pierce, MD;  Location: [facility];   Service: Ophthalmology;  Laterality: Left;  CDE: 4.71   CATARACT EXTRACTION W/PHACO Right 05/04/2018   Procedure: CATARACT EXTRACTION PHACO AND INTRAOCULAR LENS PLACEMENT (IOC);  Surgeon: Fabio Pierce, MD;  Location: [facility];  Service: Ophthalmology;  Laterality: Right;  CDE: 5.56   CORRECTION HAMMER TOE Bilateral    REPLACEMENT TOTAL KNEE Right 2011   VAGINAL HYSTERECTOMY      Family History  Problem Relation Age of Onset   Brain cancer Mother    Dementia Father    Emphysema Father    Seizures Sister    Blindness Sister    Alcohol abuse Maternal Grandfather    Breast cancer Neg Hx     Social History   Tobacco Use   Smoking status: Former    Current packs/day: 0.00    Average packs/day: 1 pack/day for 20.0 years (20.0 ttl pk-yrs)    Types: Cigarettes    Start date: 09/20/1987    Quit date: 09/20/2007    Years since quitting: 15.9   Smokeless tobacco: Never  Vaping Use   Vaping status: Never Used  Substance Use Topics   Alcohol use: Yes    Comment: Occasional   Drug use: No    Medications: I have reviewed the patient's current medications. Allergies as of 08/29/2023   No Known Allergies      Medication List        Accurate as of August 29, 2023  1:33 PM. If you have any questions, ask your nurse or doctor.          STOP taking  these medications    buPROPion 150 MG 12 hr tablet Commonly known as: WELLBUTRIN SR Stopped by: Alverta Caccamo A Halston Kintz   doxepin 10 MG capsule Commonly known as: SINEQUAN Stopped by: Kyle Stansell A Javonni Macke   Florajen Women Caps Stopped by: Latrell Reitan A Kade Demicco   meclizine 12.5 MG tablet Commonly known as: ANTIVERT Stopped by: Darrick Greenlaw A Aletha Allebach   midodrine 2.5 MG tablet Commonly known as: PROAMATINE Stopped by: Takuma Cifelli A Selyna Klahn       TAKE these medications    atorvastatin 20 MG tablet Commonly known as: LIPITOR Take 20 mg by mouth daily.   Blood Pressure Monitor Kit 1 each by Does not apply route as  directed. Dx: diastolic dysfunction   Breztri Aerosphere 160-9-4.8 MCG/ACT Aero Generic drug: Budeson-Glycopyrrol-Formoterol Inhale into the lungs as needed.   diclofenac 75 MG EC tablet Commonly known as: VOLTAREN Take 75 mg by mouth 2 (two) times daily.   furosemide 20 MG tablet Commonly known as: LASIX Take 1 tablet (20 mg total) by mouth daily as needed (swelling or 2-3 lbs weight gain in 24 hours).   glycopyrrolate 1 MG tablet Commonly known as: ROBINUL Take 1 mg by mouth 2 (two) times daily.   latanoprost 0.005 % ophthalmic solution Commonly known as: XALATAN Place 1 drop into both eyes at bedtime.   memantine 5 MG tablet Commonly known as: NAMENDA Take 5 mg by mouth daily.   pregabalin 100 MG capsule Commonly known as: LYRICA Take 100 mg by mouth daily.   propranolol 10 MG tablet Commonly known as: INDERAL Take 10 mg by mouth at bedtime.   temazepam 30 MG capsule Commonly known as: RESTORIL Take 1 capsule by mouth at bedtime as needed.         ROS:  Constitutional: negative for chills, fatigue, and fevers Eyes: negative for visual disturbance and pain Ears, nose, mouth, throat, and face: positive for sinus problems, negative for ear drainage and sore throat Respiratory: positive for shortness of breath, negative for cough and wheezing Cardiovascular: positive for chest pain Gastrointestinal: positive for abdominal pain, negative for nausea, reflux symptoms, and vomiting Genitourinary:negative for dysuria and frequency Integument/breast: negative for dryness and rash Hematologic/lymphatic: negative for bleeding and lymphadenopathy Musculoskeletal:negative for back pain and neck pain Neurological: positive for dizziness and tremors Endocrine: negative for temperature intolerance  Blood pressure 134/80, pulse 86, temperature 97.7 F (36.5 C), temperature source Oral, resp. rate 18, height 5\' 6"  (1.676 m), weight 209 lb (94.8 kg), SpO2 96%. Physical  Exam Vitals reviewed.  HENT:     Head: Normocephalic and atraumatic.  Eyes:     Extraocular Movements: Extraocular movements intact.     Pupils: Pupils are equal, round, and reactive to light.  Cardiovascular:     Rate and Rhythm: Normal rate and regular rhythm.  Pulmonary:     Effort: Pulmonary effort is normal.     Breath sounds: Normal breath sounds.  Abdominal:     Comments: Abdomen soft, nondistended, no percussion tenderness, nontender to palpation; no rigidity, guarding, rebound tenderness; negative Murphy sign  Musculoskeletal:     Cervical back: Normal range of motion.  Skin:    General: Skin is warm and dry.  Neurological:     General: No focal deficit present.     Mental Status: She is alert and oriented to person, place, and time.  Psychiatric:        Mood and Affect: Mood normal.        Behavior: Behavior normal.  Results: Abdominal ultrasound (08/10/2023): Impression: 1.  Cholelithiasis without secondary signs of acute cholecystitis 2.  Increased hepatic parenchymal echogenicity suggestive of steatosis   Assessment & Plan:  Victoria Adkins is a 72 y.o. female who presents for evaluation of cholelithiasis.  Imaging and blood work evaluated by myself.  -We discussed the pathophysiology of gallbladder disease, and why we recommend surgical excision.  We discussed that her pain underneath her breast could be referred pain from her gallbladder, though she does not have pain in the right upper quadrant of her abdomen is had no other associated gallbladder symptoms. -I counseled the patient about the indication, risks and benefits of robotic laparoscopic cholecystectomy.  She understands there is a very small chance for bleeding, infection, injury to normal structures (including common bile duct), conversion to open surgery, persistent symptoms, evolution of postcholecystectomy diarrhea, need for secondary interventions, anesthesia reaction, cardiopulmonary issues and  other risks not specifically detailed here. I described the expected recovery, the plan for follow-up and the restrictions during the recovery phase.  All questions were answered. -Prior to scheduling the patient for any surgical interventions, she is going to follow-up with hematology regarding her thrombocytopenia and we will await her PET/CT to evaluate cardiac perfusion.  She will need cardiac clearance prior to any surgical interventions -In the meantime, advised the patient should try low-fat diet to see if this improves this right upper abdominal/inframammary pain.  If her pain improves with a low-fat diet, it is possible that some of her symptoms are related to her gallbladder disease. -Information provided to the patient regarding cholelithiasis, cholecystectomy, cholecystitis, and low-fat diet -Advised that she needs to present to the emergency department if she begins to have nausea, vomiting, fever, chills, worsening right upper quadrant abdominal pain, and jaundice -Will await input from hematology and cardiology  All questions were answered to the satisfaction of the patient and family.  Theophilus Kinds, DO [facility] [address] [place], [place] [address] [phone] (office)

## 2023-08-30 ENCOUNTER — Telehealth (HOSPITAL_BASED_OUTPATIENT_CLINIC_OR_DEPARTMENT_OTHER): Payer: Self-pay | Admitting: *Deleted

## 2023-08-30 ENCOUNTER — Encounter: Payer: Self-pay | Admitting: *Deleted

## 2023-08-30 DIAGNOSIS — R079 Chest pain, unspecified: Secondary | ICD-10-CM

## 2023-08-30 NOTE — Telephone Encounter (Signed)
Hi Marcelino Duster,  You saw Ms. Behlke in office on 08/21/2023 and she is scheduled to undergo a robotic assisted lap chole.  Could you please comment on surgical clearance for her upcoming procedure.  Please forward your response back to the preop clearance box.  Thanks,  Alden Server

## 2023-08-30 NOTE — Telephone Encounter (Signed)
   Pre-operative Risk Assessment    Patient Name: Victoria Adkins  DOB: 1950-10-19 MRN: [id number]   Last OV: Jacolyn Reedy, PA 08/21/2023 Upcoming OV: Dr. Diona Browner 03/05/2023  Request for Surgical Clearance    Procedure:   XI Robotic Assisted Laparoscopic Cholecystectomy  Date of Surgery:  Clearance TBD                                 Surgeon:  Dr. Theophilus Kinds Surgeon's Group or Practice Name:  [facility] Phone number:  [phone] Fax number:  [phone]   Type of Clearance Requested:   - Medical    Type of Anesthesia:  General    Additional requests/questions:    Signed, Emmit Pomfret   08/30/2023, 12:58 PM

## 2023-09-03 DIAGNOSIS — D696 Thrombocytopenia, unspecified: Secondary | ICD-10-CM | POA: Insufficient documentation

## 2023-09-04 ENCOUNTER — Inpatient Hospital Stay: Payer: Medicare HMO | Attending: Hematology | Admitting: Hematology

## 2023-09-04 ENCOUNTER — Encounter: Payer: Self-pay | Admitting: Hematology

## 2023-09-04 ENCOUNTER — Inpatient Hospital Stay: Payer: Medicare HMO

## 2023-09-04 VITALS — BP 129/62 | HR 68 | Temp 97.9°F | Resp 16 | Ht 66.0 in | Wt 208.2 lb

## 2023-09-04 DIAGNOSIS — Z87891 Personal history of nicotine dependence: Secondary | ICD-10-CM | POA: Insufficient documentation

## 2023-09-04 DIAGNOSIS — D696 Thrombocytopenia, unspecified: Secondary | ICD-10-CM | POA: Diagnosis not present

## 2023-09-04 DIAGNOSIS — Z808 Family history of malignant neoplasm of other organs or systems: Secondary | ICD-10-CM | POA: Insufficient documentation

## 2023-09-04 DIAGNOSIS — Z79899 Other long term (current) drug therapy: Secondary | ICD-10-CM | POA: Diagnosis not present

## 2023-09-04 DIAGNOSIS — Z803 Family history of malignant neoplasm of breast: Secondary | ICD-10-CM | POA: Insufficient documentation

## 2023-09-04 LAB — CBC WITH DIFFERENTIAL/PLATELET
Abs Immature Granulocytes: 0.19 10*3/uL — ABNORMAL HIGH (ref 0.00–0.07)
Basophils Absolute: 0.1 10*3/uL (ref 0.0–0.1)
Basophils Relative: 1 %
Eosinophils Absolute: 0.4 10*3/uL (ref 0.0–0.5)
Eosinophils Relative: 4 %
HCT: 37.7 % (ref 36.0–46.0)
Hemoglobin: 11.6 g/dL — ABNORMAL LOW (ref 12.0–15.0)
Immature Granulocytes: 2 %
Lymphocytes Relative: 14 %
Lymphs Abs: 1.4 10*3/uL (ref 0.7–4.0)
MCH: 26.7 pg (ref 26.0–34.0)
MCHC: 30.8 g/dL (ref 30.0–36.0)
MCV: 86.7 fL (ref 80.0–100.0)
Monocytes Absolute: 0.5 10*3/uL (ref 0.1–1.0)
Monocytes Relative: 5 %
Neutro Abs: 7.4 10*3/uL (ref 1.7–7.7)
Neutrophils Relative %: 74 %
Platelets: 120 10*3/uL — ABNORMAL LOW (ref 150–400)
RBC: 4.35 MIL/uL (ref 3.87–5.11)
RDW: 13.8 % (ref 11.5–15.5)
WBC: 10.1 10*3/uL (ref 4.0–10.5)
nRBC: 0 % (ref 0.0–0.2)

## 2023-09-04 LAB — HEPATITIS B SURFACE ANTIBODY,QUALITATIVE: Hep B S Ab: NONREACTIVE

## 2023-09-04 LAB — LACTATE DEHYDROGENASE: LDH: 168 U/L (ref 98–192)

## 2023-09-04 LAB — VITAMIN B12: Vitamin B-12: 313 pg/mL (ref 180–914)

## 2023-09-04 LAB — FERRITIN: Ferritin: 7 ng/mL — ABNORMAL LOW (ref 11–307)

## 2023-09-04 LAB — IRON AND TIBC
Iron: 40 ug/dL (ref 28–170)
Saturation Ratios: 9 % — ABNORMAL LOW (ref 10.4–31.8)
TIBC: 431 ug/dL (ref 250–450)
UIBC: 391 ug/dL

## 2023-09-04 LAB — C-REACTIVE PROTEIN: CRP: 0.6 mg/dL

## 2023-09-04 LAB — SEDIMENTATION RATE: Sed Rate: 27 mm/h — ABNORMAL HIGH (ref 0–22)

## 2023-09-04 LAB — HEPATITIS B CORE ANTIBODY, TOTAL: Hep B Core Total Ab: NONREACTIVE

## 2023-09-04 LAB — FOLATE: Folate: 11.5 ng/mL

## 2023-09-04 LAB — HEPATITIS B SURFACE ANTIGEN: Hepatitis B Surface Ag: NONREACTIVE

## 2023-09-04 LAB — TSH: TSH: 2.304 u[IU]/mL (ref 0.350–4.500)

## 2023-09-04 LAB — HEPATITIS C ANTIBODY: HCV Ab: NONREACTIVE

## 2023-09-04 NOTE — Patient Instructions (Signed)
You were seen and examined today by Dr. Katragadda. Dr. Katragadda is a hematologist, meaning that he specializes in blood abnormalities. Dr. Katragadda discussed your past medical history, family history of cancers/blood conditions and the events that led to you being here today.  You were referred to Dr. Katragadda due to thrombocytopenia (low platelets).  Dr. Katragadda has recommended additional labs today for further evaluation.  Follow-up as scheduled.  

## 2023-09-04 NOTE — Progress Notes (Signed)
[facility] [address], [place] [address]   Clinic Day:  09/04/2023  Referring physician: Ignatius Specking, MD  Patient Care Team: Ignatius Specking, MD as PCP - General (Internal Medicine) Jonelle Sidle, MD as PCP - Cardiology (Cardiology)   ASSESSMENT & PLAN:   Assessment:  1.  Mild thrombocytopenia: - Patient seen at the request of Dr. Sherril Croon - CBC on 08/01/2023, PLT-79K with platelet clumps noted - CBC (08/10/2023): PLT-107 - Reports easy bruising on the forearms and hands for several years.  No active bleeding. - Ultrasound abdomen (08/10/2023): Spleen normal in size and appearance.  Hepatic steatosis.  Cholelithiasis.  2.  Social/family history: - Lives at home by herself and is independent of ADLs and IADLs.  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  No chemical/pesticide exposure more than usual.  Quit smoking 12 years ago.  Smoked 1 pack/day for 20 years. - Maternal aunt had breast cancer.  Mother died of brain tumor.  Maternal uncle died of brain tumor.   Plan:  1.  Mild thrombocytopenia: - I have reviewed her CBC in our system.  She has history of mild thrombocytopenia intermittently since 2011. - Will repeat platelet count today and check for nutritional deficiencies, connective tissue disorders, infectious etiologies and bone marrow infiltrative process. - RTC 3 weeks for follow-up. - Differential diagnosis includes immune mediated thrombocytopenia and early MDS.   Orders Placed This Encounter  Procedures   Hepatitis B core antibody, total    Standing Status:   Future    Number of Occurrences:   1    Expected Date:   09/04/2023    Expiration Date:   09/03/2024   Hepatitis C antibody    Standing Status:   Future    Number of Occurrences:   1    Expected Date:   09/04/2023    Expiration Date:   09/03/2024   Hepatitis B surface antibody,qualitative    Standing Status:   Future    Number of Occurrences:   1    Expected Date:    09/04/2023    Expiration Date:   09/03/2024   Hepatitis B surface antigen    Standing Status:   Future    Number of Occurrences:   1    Expected Date:   09/04/2023    Expiration Date:   09/03/2024   Iron and TIBC ([facility] ONLY)    Standing Status:   Future    Number of Occurrences:   1    Expected Date:   09/04/2023    Expiration Date:   09/03/2024   Ferritin    Standing Status:   Future    Number of Occurrences:   1    Expected Date:   09/04/2023    Expiration Date:   09/03/2024   Vitamin B12    Standing Status:   Future    Number of Occurrences:   1    Expected Date:   09/04/2023    Expiration Date:   09/03/2024   Folate    Standing Status:   Future    Number of Occurrences:   1    Expected Date:   09/04/2023    Expiration Date:   09/03/2024   Lactate dehydrogenase    Standing Status:   Future    Number of Occurrences:   1    Expected Date:   09/04/2023    Expiration Date:   09/03/2024   Methylmalonic acid, serum  Standing Status:   Future    Number of Occurrences:   1    Expected Date:   09/04/2023    Expiration Date:   09/03/2024   Copper, serum    Standing Status:   Future    Number of Occurrences:   1    Expected Date:   09/04/2023    Expiration Date:   09/03/2024   ANA, IFA (with reflex)    Standing Status:   Future    Number of Occurrences:   1    Expected Date:   09/04/2023    Expiration Date:   09/03/2024   Rheumatoid factor    Standing Status:   Future    Number of Occurrences:   1    Expected Date:   09/04/2023    Expiration Date:   09/03/2024   C-reactive protein    Standing Status:   Future    Number of Occurrences:   1    Expected Date:   09/04/2023    Expiration Date:   09/03/2024   Kappa/lambda light chains    Standing Status:   Future    Number of Occurrences:   1    Expected Date:   09/04/2023    Expiration Date:   09/03/2024   Immunofixation electrophoresis    Standing Status:   Future    Number of Occurrences:    1    Expected Date:   09/04/2023    Expiration Date:   09/03/2024   Protein electrophoresis, serum    Standing Status:   Future    Number of Occurrences:   1    Expected Date:   09/04/2023    Expiration Date:   09/03/2024   Sedimentation rate    Standing Status:   Future    Number of Occurrences:   1    Expected Date:   09/04/2023    Expiration Date:   09/03/2024   TSH    Standing Status:   Future    Number of Occurrences:   1    Expected Date:   09/04/2023    Expiration Date:   09/03/2024   CBC with Differential    Standing Status:   Future    Number of Occurrences:   1    Expected Date:   09/04/2023    Expiration Date:   09/03/2024      Mikeal Hawthorne R Teague,acting as a scribe for Doreatha Massed, MD.,have documented all relevant documentation on the behalf of Doreatha Massed, MD,as directed by  Doreatha Massed, MD while in the presence of Doreatha Massed, MD.   I, Doreatha Massed MD, have reviewed the above documentation for accuracy and completeness, and I agree with the above.   Doreatha Massed, MD   12/16/20244:13 PM  CHIEF COMPLAINT/PURPOSE OF CONSULT:   Diagnosis: Mild thrombocytopenia  Current Therapy: Under workup  HISTORY OF PRESENT ILLNESS:   Victoria Adkins is a 72 y.o. female presenting to clinic today for evaluation of thrombocytopenia at the request of Vyas, Dhruv B, MD.  Patient was seen in consult with Dr. Sherril Croon on 08/10/23 for a US of the abdomen which found cholelithiasis without secondary signs of acute cholecystitis and increased hepatic parenchymal echogenicity suggestive of steatosis. Prior to this, she had thrombocytopenia and RUQ pain. She was then seen by cardiology on 08/21/23, who ordered a PET/CT cardiac perfusion and referral to endocrinology. She saw Dr. Robyne Peers on 08/29/23 to discuss cholelithiasis and possible cholecystectomy. She was found to have abnormal CBC from  08/10/23 with low platelets at 107, MCV was normal at 85.    Today, she states that she is doing well overall. Her appetite level is at 100%. Her energy level is at 40%.    PAST MEDICAL HISTORY:   Past Medical History: Past Medical History:  Diagnosis Date   Anxiety    Arthritis    COPD (chronic obstructive pulmonary disease) (HCC)    Depression    Dyspnea    Hyperlipidemia     Surgical History: Past Surgical History:  Procedure Laterality Date   CATARACT EXTRACTION W/PHACO Left 04/20/2018   Procedure: CATARACT EXTRACTION PHACO AND INTRAOCULAR LENS PLACEMENT (IOC);  Surgeon: Fabio Pierce, MD;  Location: [facility];  Service: Ophthalmology;  Laterality: Left;  CDE: 4.71   CATARACT EXTRACTION W/PHACO Right 05/04/2018   Procedure: CATARACT EXTRACTION PHACO AND INTRAOCULAR LENS PLACEMENT (IOC);  Surgeon: Fabio Pierce, MD;  Location: [facility];  Service: Ophthalmology;  Laterality: Right;  CDE: 5.56   CORRECTION HAMMER TOE Bilateral    REPLACEMENT TOTAL KNEE Right 2011   VAGINAL HYSTERECTOMY      Social History: Social History   Socioeconomic History   Marital status: Divorced    Spouse name: Not on file   Number of children: 1   Years of education: Not on file   Highest education level: High school graduate  Occupational History   Not on file  Tobacco Use   Smoking status: Former    Current packs/day: 0.00    Average packs/day: 1 pack/day for 20.0 years (20.0 ttl pk-yrs)    Types: Cigarettes    Start date: 09/20/1987    Quit date: 09/20/2007    Years since quitting: 15.9   Smokeless tobacco: Never  Vaping Use   Vaping status: Never Used  Substance and Sexual Activity   Alcohol use: Yes    Comment: Occasional   Drug use: No   Sexual activity: Not on file  Other Topics Concern   Not on file  Social History Narrative   Not on file   Social Drivers of Health   Financial Resource Strain: Low Risk  (04/12/2018)   Overall Financial Resource Strain (CARDIA)    Difficulty of Paying Living Expenses: Not hard at all  Food  Insecurity: No Food Insecurity (09/04/2023)   Hunger Vital Sign    Worried About Running Out of Food in the Last Year: Never true    Ran Out of Food in the Last Year: Never true  Transportation Needs: No Transportation Needs (09/04/2023)   PRAPARE - Administrator, Civil Service (Medical): No    Lack of Transportation (Non-Medical): No  Physical Activity: Insufficiently Active (03/06/2018)   [facility], [facility]   Exercise Vital Sign    Days of Exercise per Week: 2 days    Minutes of Exercise per Session: 30 min  Stress: Stress Concern Present (03/06/2018)   Received from [facility], [facility] of Occupational Health - Occupational Stress Questionnaire    Feeling of Stress : Rather much  Social Connections: Not on file  Intimate Partner Violence: Not At Risk (09/04/2023)   Humiliation, Afraid, Rape, and Kick questionnaire    Fear of Current or Ex-Partner: No    Emotionally Abused: No    Physically Abused: No    Sexually Abused: No    Family History: Family History  Problem Relation Age of Onset   Brain cancer Mother    Dementia  Father    Emphysema Father    Seizures Sister    Blindness Sister    Alcohol abuse Maternal Grandfather    Breast cancer Neg Hx     Current Medications:  Current Outpatient Medications:    Blood Pressure Monitor KIT, 1 each by Does not apply route as directed. Dx: diastolic dysfunction, Disp: 1 kit, Rfl: 0   BREZTRI AEROSPHERE 160-9-4.8 MCG/ACT AERO, Inhale into the lungs as needed., Disp: , Rfl:    diclofenac (VOLTAREN) 75 MG EC tablet, Take 75 mg by mouth 2 (two) times daily., Disp: , Rfl:    furosemide (LASIX) 20 MG tablet, Take 1 tablet (20 mg total) by mouth daily as needed (swelling or 2-3 lbs weight gain in 24 hours)., Disp: 30 tablet, Rfl: 2   glycopyrrolate (ROBINUL) 1 MG tablet, Take 1 mg by mouth 2 (two) times daily., Disp: , Rfl:    latanoprost (XALATAN) 0.005 %  ophthalmic solution, Place 1 drop into both eyes at bedtime., Disp: , Rfl:    memantine (NAMENDA) 5 MG tablet, Take 5 mg by mouth daily., Disp: , Rfl:    pregabalin (LYRICA) 100 MG capsule, Take 100 mg by mouth daily., Disp: , Rfl:    propranolol (INDERAL) 10 MG tablet, Take 10 mg by mouth at bedtime., Disp: , Rfl:    simvastatin (ZOCOR) 40 MG tablet, Take 40 mg by mouth at bedtime., Disp: , Rfl:    temazepam (RESTORIL) 30 MG capsule, Take 1 capsule by mouth at bedtime as needed., Disp: , Rfl:    Allergies: No Known Allergies  REVIEW OF SYSTEMS:   Review of Systems  Respiratory:  Positive for shortness of breath.   Neurological:  Positive for dizziness and numbness.  Psychiatric/Behavioral:  Positive for depression. The patient is nervous/anxious.   All other systems reviewed and are negative.    VITALS:   Blood pressure 129/62, pulse 68, temperature 97.9 F (36.6 C), temperature source Oral, resp. rate 16, height 5\' 6"  (1.676 m), weight 208 lb 3.2 oz (94.4 kg), SpO2 97%.  Wt Readings from Last 3 Encounters:  09/04/23 208 lb 3.2 oz (94.4 kg)  08/29/23 209 lb (94.8 kg)  08/21/23 209 lb 3.2 oz (94.9 kg)    Body mass index is 33.6 kg/m.   PHYSICAL EXAM:   Physical Exam Vitals reviewed.  Constitutional:      Appearance: Normal appearance.  Cardiovascular:     Rate and Rhythm: Normal rate and regular rhythm.     Heart sounds: Normal heart sounds.  Pulmonary:     Effort: Pulmonary effort is normal.     Breath sounds: Normal breath sounds.  Abdominal:     General: There is no distension.     Palpations: Abdomen is soft.  Musculoskeletal:     Right lower leg: No edema.     Left lower leg: No edema.  Neurological:     Mental Status: She is alert.  Psychiatric:        Mood and Affect: Mood normal.        Behavior: Behavior normal.     LABS:      Latest Ref Rng & Units 09/04/2023    2:05 PM 03/16/2021    3:07 PM 06/26/2018    2:39 PM  CBC  WBC 4.0 - 10.5 K/uL 10.1   9.5  9.7   Hemoglobin 12.0 - 15.0 g/dL 11.9  6.9  14.7   Hematocrit 36.0 - 46.0 % 37.7  25.9  38.4  Platelets 150 - 400 K/uL 120  165  165.0       Latest Ref Rng & Units 03/16/2021    3:07 PM 06/26/2018    2:39 PM 04/13/2018   11:24 AM  CMP  Glucose 70 - 99 mg/dL 99  89  91   BUN 8 - 23 mg/dL 21  20  15    Creatinine 0.44 - 1.00 mg/dL 9.60  4.54  0.98   Sodium 135 - 145 mmol/L 137  136  139   Potassium 3.5 - 5.1 mmol/L 4.1  4.0  4.4   Chloride 98 - 111 mmol/L 104  99  104   CO2 22 - 32 mmol/L 26  30  27    Calcium 8.9 - 10.3 mg/dL 8.9  9.7  9.2      No results found for: "CEA1", "CEA" / No results found for: "CEA1", "CEA" No results found for: "PSA1" No results found for: "JXB147" No results found for: "CAN125"  No results found for: "TOTALPROTELP", "ALBUMINELP", "A1GS", "A2GS", "BETS", "BETA2SER", "GAMS", "MSPIKE", "SPEI" Lab Results  Component Value Date   TIBC 431 09/04/2023   FERRITIN 7 (L) 09/04/2023   IRONPCTSAT 9 (L) 09/04/2023   Lab Results  Component Value Date   LDH 168 09/04/2023     STUDIES:   No results found.

## 2023-09-05 LAB — KAPPA/LAMBDA LIGHT CHAINS
Kappa free light chain: 24.9 mg/L — ABNORMAL HIGH (ref 3.3–19.4)
Kappa, lambda light chain ratio: 1.38 (ref 0.26–1.65)
Lambda free light chains: 18.1 mg/L (ref 5.7–26.3)

## 2023-09-05 LAB — RHEUMATOID FACTOR: Rheumatoid fact SerPl-aCnc: <10 [IU]/mL

## 2023-09-06 ENCOUNTER — Ambulatory Visit: Payer: Medicare HMO | Admitting: Psychiatry

## 2023-09-06 DIAGNOSIS — F411 Generalized anxiety disorder: Secondary | ICD-10-CM

## 2023-09-06 LAB — COPPER, SERUM: Copper: 139 ug/dL (ref 80–158)

## 2023-09-06 NOTE — Progress Notes (Signed)
[facility] Counselor/Therapist Progress Note  Patient ID: Victoria Adkins, MRN: [id number],    Date: 09/06/2023  Time Spent: 53 minutes  Treatment Type: Individual Therapy  Reported Symptoms: anxiety, depression, sadness/hurt by family member, frustration   Mental Status Exam:  Appearance:   Casual and Neat     Behavior:  Appropriate, Sharing, and Motivated  Motor:  Normal  Speech/Language:   Clear and Coherent  Affect:  Depressed and anxious  Mood:  anxious and depressed  Thought process:  goal directed  Thought content:    WNL  Sensory/Perceptual disturbances:    WNL  Orientation:  oriented to person, place, time/date, situation, day of week, month of year, year, and stated date of Dec. 18, 2024  Attention:  Good  Concentration:  Good  Memory:  WNL  Fund of knowledge:   Good  Insight:    Good and Fair  Judgment:   Good  Impulse Control:  Good   Risk Assessment: Danger to Self:  No Self-injurious Behavior: No Danger to Others: No Duty to Warn:no Physical Aggression / Violence:No  Access to Firearms a concern: No  Gang Involvement:No   Subjective:   Patient today reporting in session symptoms of anxiety, depression, hurt, frustration.  States anxiety is her stronger symptom overall.  Currently trying to manage depression, hurt, and anxiety related to her difficult relationship with her adult daughter (age 37), which is very hurtful for patient and she processed this in session today. Patient had not had contact with daughter for a while and reached out to her recently.  Follow up with hemotologist, and "they can't figure out what's wrong with my blood, they say my platelets are low." Is concerned about her health but not overly worried; waiting to hear back from Dr about her lab work. Worked on not jumping to conclusions and this was helpful for her. Working on Physicist, medical re: fractured relationship with adult daughter, and things "out of my control".  Patient is  handling some of her adversities whether it be health issues or family issues, in more constructive and helpful ways.  Does see how the emotional turmoil with her daughter feeds some problems in other areas of her life as well.  Today showed increased strength and perseverance as she shared and worked on specific issues as noted above.  Showing improved strength.  Even with significant sadness and concern, patient also spoke of some hopefulness and her outlook.  Interventions: Cognitive Behavioral Therapy, Solution-Oriented/Positive Psychology, and Ego-Supportive  Long term goal: Reduce overall level, frequency, and intensity of the anxiety so that daily functioning is not impaired.  "Work on not assuming the worst until it is proven different". Short term goal: Verbalize an  understanding of how thoughts, physical feelings, and behavioral actions contribute to anxiety and its treatment.  Work with behavioral changes that can help alleviate anxiety. Strategies: Complete anxiety homework exercises that identify cognitive distortions that generate anxious feeling.  Diagnosis:   ICD-10-CM   1. Generalized anxiety disorder  F41.1      Plan:  Patient continues to work today on challenges with her adult daughter where there is little contact, her anxiety, difficulty with self-confidence, and wanting to eventually feel more peace and calmness within herself especially surrounding some issues in the past.  She wants to resolve these issues in order to focus more on the present.  Has made progress and needs to continue her work with goal-directed behaviors to keep moving in a healthier and  more hopeful direction. Reminded and encouraged patient in her practice of more positive and self affirming behaviors as noted in sessions including: Getting out more within her new condo neighborhood and intentionally trying to meet more people gradually, better understand that her anxious thoughts are often "just  thoughts" and are not necessarily going to play out in reality the way she might imagine, look for more positives daily, stay on her prescribed medication, remain in the present and focused on what she can change her control, refrain from assuming negatives and worst-case scenarios, use of positive self talk, practice more intentional listening to others that are helpful, reduce overthinking and over analyzing, letting go of things from the past that can hold her back now, be willing to work on some of the rigid ways of thinking that she has had over the years and that get in her way of moving forward as she heals emotionally, and recognize the strengths she shows working with goal-directed behaviors to move in a direction that supports her improved emotional health and her overall wellbeing.  Goal review and progress/challenges noted with patient.  Next appointment within 2 to 3 weeks.   Mathis Fare, LCSW

## 2023-09-07 ENCOUNTER — Ambulatory Visit: Payer: Medicare HMO | Admitting: Cardiology

## 2023-09-07 LAB — ANTINUCLEAR ANTIBODIES, IFA: ANA Ab, IFA: NEGATIVE

## 2023-09-10 LAB — PROTEIN ELECTROPHORESIS, SERUM
A/G Ratio: 1.6 (ref 0.7–1.7)
Albumin ELP: 4 g/dL (ref 2.9–4.4)
Alpha-1-Globulin: 0.2 g/dL (ref 0.0–0.4)
Alpha-2-Globulin: 0.7 g/dL (ref 0.4–1.0)
Beta Globulin: 1 g/dL (ref 0.7–1.3)
Gamma Globulin: 0.6 g/dL (ref 0.4–1.8)
Globulin, Total: 2.5 g/dL (ref 2.2–3.9)
M-Spike, %: 0.2 g/dL — ABNORMAL HIGH
Total Protein ELP: 6.5 g/dL (ref 6.0–8.5)

## 2023-09-11 LAB — METHYLMALONIC ACID, SERUM: Methylmalonic Acid, Quantitative: 396 nmol/L — ABNORMAL HIGH (ref 0–378)

## 2023-09-15 LAB — IMMUNOFIXATION ELECTROPHORESIS
IgA: 178 mg/dL (ref 64–422)
IgG (Immunoglobin G), Serum: 734 mg/dL (ref 586–1602)
IgM (Immunoglobulin M), Srm: 80 mg/dL (ref 26–217)
Total Protein ELP: 6.7 g/dL (ref 6.0–8.5)

## 2023-09-27 ENCOUNTER — Inpatient Hospital Stay: Payer: Medicare HMO | Attending: Hematology | Admitting: Oncology

## 2023-09-27 VITALS — BP 105/42 | HR 77 | Temp 97.5°F | Resp 16 | Wt 204.8 lb

## 2023-09-27 DIAGNOSIS — Z803 Family history of malignant neoplasm of breast: Secondary | ICD-10-CM | POA: Diagnosis not present

## 2023-09-27 DIAGNOSIS — Z79899 Other long term (current) drug therapy: Secondary | ICD-10-CM | POA: Diagnosis not present

## 2023-09-27 DIAGNOSIS — Z808 Family history of malignant neoplasm of other organs or systems: Secondary | ICD-10-CM | POA: Insufficient documentation

## 2023-09-27 DIAGNOSIS — D696 Thrombocytopenia, unspecified: Secondary | ICD-10-CM | POA: Diagnosis not present

## 2023-09-27 DIAGNOSIS — E611 Iron deficiency: Secondary | ICD-10-CM | POA: Insufficient documentation

## 2023-09-27 DIAGNOSIS — D472 Monoclonal gammopathy: Secondary | ICD-10-CM | POA: Diagnosis not present

## 2023-09-27 DIAGNOSIS — E538 Deficiency of other specified B group vitamins: Secondary | ICD-10-CM | POA: Insufficient documentation

## 2023-09-27 DIAGNOSIS — Z87891 Personal history of nicotine dependence: Secondary | ICD-10-CM | POA: Insufficient documentation

## 2023-09-27 DIAGNOSIS — D509 Iron deficiency anemia, unspecified: Secondary | ICD-10-CM | POA: Insufficient documentation

## 2023-09-27 HISTORY — DX: Iron deficiency anemia, unspecified: D50.9

## 2023-09-27 NOTE — Telephone Encounter (Addendum)
 Please check with patient what she prefers, as I suspect nuc would be much sooner. Thanks!

## 2023-09-27 NOTE — Telephone Encounter (Signed)
 Patient notified of date/time for [facility]. Instructions sent to pt via MyChart.

## 2023-09-27 NOTE — Telephone Encounter (Addendum)
 No, the Lexiscan would take the place of the PET. Thanks for checking.

## 2023-09-27 NOTE — Addendum Note (Signed)
 Addended by: Roseanne Reno on: 09/27/2023 08:36 AM   Modules accepted: Orders

## 2023-09-27 NOTE — Telephone Encounter (Signed)
 Helping in preop today. Will route to [facility] triage team to assist with Victoria Adkins question below - if cardiac PET is booked out too far, Victoria would like to switch to Lexiscan . Please send update of decision of testing date back to preop team. Will retain chart in box until completion of study.

## 2023-09-27 NOTE — Telephone Encounter (Signed)
 Spoke to pt who prefers Lexiscan Myoview to PET CT. Will put orders in for ischemic testing. Does pt need to keep PET CT?

## 2023-09-27 NOTE — Progress Notes (Signed)
 [facility] [address] [place] [address]   Clinic Day:  10/10/2023  Referring physician: Rosamond Leta NOVAK, MD  Patient Care Team: Rosamond Leta NOVAK, MD as PCP - General (Internal Medicine) Debera Jayson MATSU, MD as PCP - Cardiology (Cardiology)   ASSESSMENT & PLAN:   Assessment:  1.  Mild thrombocytopenia: - Patient seen at the request of Dr. Rosamond - CBC on 08/01/2023, PLT-79K with platelet clumps noted - CBC (08/10/2023): PLT-107 - Reports easy bruising on the forearms and hands for several years.  No active bleeding. - Ultrasound abdomen (08/10/2023): Spleen normal in size and appearance.  Hepatic steatosis.  Cholelithiasis.  2.  Social/family history: - Lives at home by herself and is independent of ADLs and IADLs.  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  No chemical/pesticide exposure more than usual.  Quit smoking 12 years ago.  Smoked 1 pack/day for 20 years. - Maternal aunt had breast cancer.  Mother died of brain tumor.  Maternal uncle died of brain tumor.   Plan:  1.  Mild thrombocytopenia: - Labs from 09/04/2023 show hemoglobin 11.6, platelet count 120,000 with normal WBC.  Severe iron  deficiency with ferritin of 7 and iron  saturation of 9%.  TIBC normal.  Copper  levels normal.  MMA elevated at 396 with normal B12 level at 313.  No evidence of connective tissue disorder with normal.  LDH is normal at 168.  Sed rate is mildly elevated at 27.  TSH is normal.  -SPEP shows M spike of 0.2.  Immunofixation shows IgG monoclonal protein with lambda light chain specificity.  Kappa lambda light chain ratio normal.  No infectious etiology with either negative or nonreactive hep B, hep C and HIV.  -Recommend 1 g INFeD  and weekly B12 injections x 4 followed by monthly x 6. -Given M spike would recommend skeletal survey along with a UPEP 24-hour urin.  -Return to clinic in approximately 3 months for follow-up with labs a few days before.   PLAN SUMMARY: >>  Recommend 1 g INFeD  and weekly B12 injections x 4 followed by monthly x 6. >> Skeletal survey and 24-hour UPEP within the next week or so to complete workup. >> RTC in 3-4 months with labs a few days before and see NP.       Orders Placed This Encounter  Procedures   DG Bone Survey Met    Standing Status:   Future    Number of Occurrences:   1    Expected Date:   10/04/2023    Expiration Date:   09/26/2024    Reason for Exam (SYMPTOM  OR DIAGNOSIS REQUIRED):   mspike    Preferred imaging location?:   [facility]   24 hr Ur UPEP/UIFE/Light Chains/TP    Standing Status:   Future    Number of Occurrences:   1    Expected Date:   10/04/2023    Expiration Date:   09/26/2024    Delon FORBES Hope, NP   1/21/20252:34 PM  CHIEF COMPLAINT/PURPOSE OF CONSULT:   Diagnosis: Mild thrombocytopenia  Current Therapy: Under workup  HISTORY OF PRESENT ILLNESS:   Johnasia is a 73 y.o. female presenting to clinic today for follow-up and to review labs.   She was seen for initial consultation by Dr. Noralee on 09/04/23.   She denies any hospitalization, surgeries or changes to her baseline health.   Today, she states that she is doing well overall. Her appetite level is 50% and  she has no energy.   Denies any bleeding. Has chronic sob d/t copd and uses an inhaler prn. Having some chest tightness.   PAST MEDICAL HISTORY:   Past Medical History: Past Medical History:  Diagnosis Date   Anxiety    Arthritis    COPD (chronic obstructive pulmonary disease) (HCC)    Depression    Dyspnea    History of nuclear stress test    Myoview  10/02/2023: Inferoseptal defect consistent with soft tissue attenuation, basal lateral defect partially reversible (soft tissue attenuation versus minor ischemic territory); EF 59; low risk   Hyperlipidemia    Iron  deficiency anemia 09/27/2023    Surgical History: Past Surgical History:  Procedure Laterality Date   CATARACT EXTRACTION W/PHACO Left 04/20/2018    Procedure: CATARACT EXTRACTION PHACO AND INTRAOCULAR LENS PLACEMENT (IOC);  Surgeon: Harrie Agent, MD;  Location: [facility];  Service: Ophthalmology;  Laterality: Left;  CDE: 4.71   CATARACT EXTRACTION W/PHACO Right 05/04/2018   Procedure: CATARACT EXTRACTION PHACO AND INTRAOCULAR LENS PLACEMENT (IOC);  Surgeon: Harrie Agent, MD;  Location: [facility];  Service: Ophthalmology;  Laterality: Right;  CDE: 5.56   CORRECTION HAMMER TOE Bilateral    REPLACEMENT TOTAL KNEE Right 2011   VAGINAL HYSTERECTOMY      Social History: Social History   Socioeconomic History   Marital status: Divorced    Spouse name: Not on file   Number of children: 1   Years of education: Not on file   Highest education level: High school graduate  Occupational History   Not on file  Tobacco Use   Smoking status: Former    Current packs/day: 0.00    Average packs/day: 1 pack/day for 20.0 years (20.0 ttl pk-yrs)    Types: Cigarettes    Start date: 09/20/1987    Quit date: 09/20/2007    Years since quitting: 16.0   Smokeless tobacco: Never  Vaping Use   Vaping status: Never Used  Substance and Sexual Activity   Alcohol  use: Yes    Comment: Occasional   Drug use: No   Sexual activity: Not on file  Other Topics Concern   Not on file  Social History Narrative   Not on file   Social Drivers of Health   Financial Resource Strain: Low Risk  (04/12/2018)   Overall Financial Resource Strain (CARDIA)    Difficulty of Paying Living Expenses: Not hard at all  Food Insecurity: No Food Insecurity (09/04/2023)   Hunger Vital Sign    Worried About Running Out of Food in the Last Year: Never true    Ran Out of Food in the Last Year: Never true  Transportation Needs: No Transportation Needs (09/04/2023)   PRAPARE - Administrator, Civil Service (Medical): No    Lack of Transportation (Non-Medical): No  Physical Activity: Insufficiently Active (03/06/2018)   [facility]    Exercise Vital Sign    Days of Exercise per Week: 2 days    Minutes of Exercise per Session: 30 min  Stress: Stress Concern Present (03/06/2018)   Received from [facility] - Occupational Stress Questionnaire    Feeling of Stress : Rather much  Social Connections: Not on file  Intimate Partner Violence: Not At Risk (09/04/2023)   Humiliation, Afraid, Rape, and Kick questionnaire    Fear of Current or Ex-Partner: No    Emotionally Abused: No    Physically Abused:  No    Sexually Abused: No    Family History: Family History  Problem Relation Age of Onset   Brain cancer Mother    Dementia Father    Emphysema Father    Seizures Sister    Blindness Sister    Alcohol  abuse Maternal Grandfather    Breast cancer Neg Hx     Current Medications:  Current Outpatient Medications:    Blood Pressure Monitor KIT, 1 each by Does not apply route as directed. Dx: diastolic dysfunction, Disp: 1 kit, Rfl: 0   BREZTRI AEROSPHERE 160-9-4.8 MCG/ACT AERO, Inhale into the lungs as needed., Disp: , Rfl:    diclofenac (VOLTAREN) 75 MG EC tablet, Take 75 mg by mouth 2 (two) times daily., Disp: , Rfl:    furosemide  (LASIX ) 20 MG tablet, Take 1 tablet (20 mg total) by mouth daily as needed (swelling or 2-3 lbs weight gain in 24 hours)., Disp: 30 tablet, Rfl: 2   glycopyrrolate  (ROBINUL ) 1 MG tablet, Take 1 mg by mouth 2 (two) times daily., Disp: , Rfl:    latanoprost (XALATAN) 0.005 % ophthalmic solution, Place 1 drop into both eyes at bedtime., Disp: , Rfl:    memantine (NAMENDA) 5 MG tablet, Take 5 mg by mouth daily., Disp: , Rfl:    pregabalin (LYRICA) 100 MG capsule, Take 100 mg by mouth daily., Disp: , Rfl:    propranolol (INDERAL) 10 MG tablet, Take 10 mg by mouth at bedtime., Disp: , Rfl:    simvastatin (ZOCOR) 40 MG tablet, Take 40 mg by mouth at bedtime., Disp: , Rfl:    temazepam (RESTORIL) 30 MG capsule, Take 1 capsule by mouth at  bedtime as needed., Disp: , Rfl:    Allergies: No Known Allergies  REVIEW OF SYSTEMS:   Review of Systems  Respiratory:  Positive for shortness of breath.   Neurological:  Positive for dizziness and numbness.  Psychiatric/Behavioral:  Positive for depression. The patient is nervous/anxious.   All other systems reviewed and are negative.    VITALS:   Blood pressure (!) 105/42, pulse 77, temperature (!) 97.5 F (36.4 C), temperature source Oral, resp. rate 16, weight 204 lb 12.9 oz (92.9 kg), SpO2 98%.  Wt Readings from Last 3 Encounters:  09/27/23 204 lb 12.9 oz (92.9 kg)  09/04/23 208 lb 3.2 oz (94.4 kg)  08/29/23 209 lb (94.8 kg)    Body mass index is 33.06 kg/m.   PHYSICAL EXAM:   Physical Exam Vitals reviewed.  Constitutional:      Appearance: Normal appearance.  Cardiovascular:     Rate and Rhythm: Normal rate and regular rhythm.     Heart sounds: Normal heart sounds.  Pulmonary:     Effort: Pulmonary effort is normal.     Breath sounds: Normal breath sounds.  Abdominal:     General: There is no distension.     Palpations: Abdomen is soft.  Musculoskeletal:     Right lower leg: No edema.     Left lower leg: No edema.  Neurological:     Mental Status: She is alert.  Psychiatric:        Mood and Affect: Mood normal.        Behavior: Behavior normal.     LABS:      Latest Ref Rng & Units 09/04/2023    2:05 PM 03/16/2021    3:07 PM 06/26/2018    2:39 PM  CBC  WBC 4.0 - 10.5 K/uL 10.1  9.5  9.7  Hemoglobin 12.0 - 15.0 g/dL 88.3  6.9  86.7   Hematocrit 36.0 - 46.0 % 37.7  25.9  38.4   Platelets 150 - 400 K/uL 120  165  165.0       Latest Ref Rng & Units 03/16/2021    3:07 PM 06/26/2018    2:39 PM 04/13/2018   11:24 AM  CMP  Glucose 70 - 99 mg/dL 99  89  91   BUN 8 - 23 mg/dL 21  20  15    Creatinine 0.44 - 1.00 mg/dL 9.04  9.10  9.14   Sodium 135 - 145 mmol/L 137  136  139   Potassium 3.5 - 5.1 mmol/L 4.1  4.0  4.4   Chloride 98 - 111 mmol/L 104   99  104   CO2 22 - 32 mmol/L 26  30  27    Calcium 8.9 - 10.3 mg/dL 8.9  9.7  9.2      No results found for: CEA1, CEA / No results found for: CEA1, CEA No results found for: PSA1 No results found for: CAN199 No results found for: RJW874  Lab Results  Component Value Date   TOTALPROTELP 6.7 09/04/2023   ALBUMINELP 4.0 09/04/2023   A1GS 0.2 09/04/2023   A2GS 0.7 09/04/2023   BETS 1.0 09/04/2023   GAMS 0.6 09/04/2023   MSPIKE 0.2 (H) 09/04/2023   SPEI Comment 09/04/2023   Lab Results  Component Value Date   TIBC 431 09/04/2023   FERRITIN 7 (L) 09/04/2023   IRONPCTSAT 9 (L) 09/04/2023   Lab Results  Component Value Date   LDH 168 09/04/2023     STUDIES:   NM Myocar Multi W/Spect W/Wall Motion / EF Result Date: 10/02/2023   Findings are equivocal. The study is low risk.   No ST deviation was noted. The ECG was negative for ischemia.   LV perfusion is equivocal.  Small, mild intensity, mid to apical inferoseptal defect that is more prominent at rest and consistent with soft tissue attenuation.  There is a smaller, also mild defect in the basal lateral wall that is partially reversible, potentially soft tissue attenuation as well although cannot exclude minor ischemic territory.   Left ventricular function is normal. Nuclear stress EF: 59%. Equivocal but overall low risk study.  Soft tissue attenuation suspected, cannot completely exclude a minor basal lateral ischemic territory.  LVEF 59%.

## 2023-09-29 ENCOUNTER — Ambulatory Visit: Payer: Medicare HMO | Admitting: Psychiatry

## 2023-10-02 ENCOUNTER — Encounter: Payer: Self-pay | Admitting: Physician Assistant

## 2023-10-02 ENCOUNTER — Encounter (HOSPITAL_BASED_OUTPATIENT_CLINIC_OR_DEPARTMENT_OTHER)
Admission: RE | Admit: 2023-10-02 | Discharge: 2023-10-02 | Disposition: A | Discharge status: Home / Self Care | Payer: Medicare HMO | Source: Ambulatory Visit | Attending: Physician Assistant | Admitting: Physician Assistant

## 2023-10-02 ENCOUNTER — Other Ambulatory Visit: Payer: Self-pay

## 2023-10-02 ENCOUNTER — Ambulatory Visit (HOSPITAL_COMMUNITY)
Admission: RE | Admit: 2023-10-02 | Discharge: 2023-10-02 | Disposition: A | Discharge status: Home / Self Care | Payer: Medicare HMO | Source: Ambulatory Visit | Attending: Physician Assistant | Admitting: Physician Assistant

## 2023-10-02 DIAGNOSIS — R079 Chest pain, unspecified: Secondary | ICD-10-CM

## 2023-10-02 DIAGNOSIS — Z9289 Personal history of other medical treatment: Secondary | ICD-10-CM | POA: Insufficient documentation

## 2023-10-02 DIAGNOSIS — E611 Iron deficiency: Secondary | ICD-10-CM | POA: Diagnosis not present

## 2023-10-02 DIAGNOSIS — D472 Monoclonal gammopathy: Secondary | ICD-10-CM

## 2023-10-02 LAB — NM MYOCAR MULTI W/SPECT W/WALL MOTION / EF
LV dias vol: 85 mL (ref 46–106)
LV sys vol: 35 mL
Nuc Stress EF: 59 %
Peak HR: 78 {beats}/min
RATE: 0.2
Rest HR: 65 {beats}/min
Rest Nuclear Isotope Dose: 10.3 mCi
SDS: 3
SRS: 6
SSS: 9
ST Depression (mm): 0 mm
Stress Nuclear Isotope Dose: 30.3 mCi
TID: 1.25

## 2023-10-02 MED ORDER — SODIUM CHLORIDE FLUSH 0.9 % IV SOLN
INTRAVENOUS | Status: AC
  Administered 2023-10-02: 10 mL via INTRAVENOUS
  Filled 2023-10-02: qty 10

## 2023-10-02 MED ORDER — TECHNETIUM TC 99M TETROFOSMIN IV KIT
30.3000 | PACK | Freq: Once | INTRAVENOUS | Status: AC | PRN
  Administered 2023-10-02: 30.3 via INTRAVENOUS

## 2023-10-02 MED ORDER — REGADENOSON 0.4 MG/5ML IV SOLN
INTRAVENOUS | Status: AC
  Administered 2023-10-02: 0.4 mg via INTRAVENOUS
  Filled 2023-10-02: qty 5

## 2023-10-02 MED ORDER — TECHNETIUM TC 99M TETROFOSMIN IV KIT
10.3000 | PACK | Freq: Once | INTRAVENOUS | Status: AC | PRN
  Administered 2023-10-02: 10.3 via INTRAVENOUS

## 2023-10-03 NOTE — Telephone Encounter (Signed)
   Patient Name: Victoria Adkins  DOB: [birth year] MRN: [id number]  Primary Cardiologist: Jayson Sierras, MD  Chart reviewed as part of pre-operative protocol coverage. Given past medical history and time since last visit, based on ACC/AHA guidelines, Victoria Adkins is at acceptable risk for the planned procedure without further cardiovascular testing.   She is not on any anticoagulation or antiplatelet therapy based upon review of her most recent notes.  The patient was advised that if she develops new symptoms prior to surgery to contact our office to arrange for a follow-up visit, and she verbalized understanding.  I will route this recommendation to the requesting party via Epic fax function and remove from pre-op pool.  Please call with questions.  Lamarr Satterfield, NP 10/03/2023, 4:43 PM

## 2023-10-03 NOTE — Telephone Encounter (Signed)
   Patient Name: Victoria Adkins  DOB: 05/15/1951 MRN: [id number]  Primary Cardiologist: Jayson Sierras, MD  Chart reviewed as part of pre-operative protocol coverage. Given past medical history and time since last visit, based on ACC/AHA guidelines, Victoria Adkins is at acceptable risk for the planned procedure without further cardiovascular testing.   Had stress test on 10/02/2023 Equivocal but overall low risk study. Soft tissue attenuation suspected, cannot completely exclude a minor basal lateral ischemic territory. LVEF 59%.   The patient was advised that if she develops new symptoms prior to surgery to contact our office to arrange for a follow-up visit, and she verbalized understanding.  I will route this recommendation to the requesting party via Epic fax function and remove from pre-op pool.  Please call with questions.  Lamarr Satterfield, NP 10/03/2023, 4:28 PM

## 2023-10-04 ENCOUNTER — Ambulatory Visit (HOSPITAL_COMMUNITY)
Admission: RE | Admit: 2023-10-04 | Discharge: 2023-10-04 | Disposition: A | Discharge status: Home / Self Care | Payer: Medicare HMO | Source: Ambulatory Visit | Attending: Oncology | Admitting: Oncology

## 2023-10-04 ENCOUNTER — Inpatient Hospital Stay: Payer: Medicare HMO

## 2023-10-04 VITALS — BP 122/70 | HR 68 | Temp 97.4°F | Resp 19

## 2023-10-04 DIAGNOSIS — E611 Iron deficiency: Secondary | ICD-10-CM | POA: Diagnosis not present

## 2023-10-04 DIAGNOSIS — D509 Iron deficiency anemia, unspecified: Secondary | ICD-10-CM

## 2023-10-04 DIAGNOSIS — D472 Monoclonal gammopathy: Secondary | ICD-10-CM

## 2023-10-04 MED ORDER — FAMOTIDINE IN NACL 20-0.9 MG/50ML-% IV SOLN
20.0000 mg | Freq: Once | INTRAVENOUS | Status: AC
  Administered 2023-10-04: 20 mg via INTRAVENOUS
  Filled 2023-10-04: qty 50

## 2023-10-04 MED ORDER — CYANOCOBALAMIN 1000 MCG/ML IJ SOLN
1000.0000 ug | Freq: Once | INTRAMUSCULAR | Status: AC
  Administered 2023-10-04: 1000 ug via INTRAMUSCULAR
  Filled 2023-10-04: qty 1

## 2023-10-04 MED ORDER — SODIUM CHLORIDE 0.9 % IV SOLN
950.0000 mg | Freq: Once | INTRAVENOUS | Status: AC
  Administered 2023-10-04: 950 mg via INTRAVENOUS
  Filled 2023-10-04: qty 19

## 2023-10-04 MED ORDER — SODIUM CHLORIDE 0.9 % IV SOLN: INTRAVENOUS | Status: DC

## 2023-10-04 MED ORDER — ACETAMINOPHEN 325 MG PO TABS
650.0000 mg | ORAL_TABLET | Freq: Once | ORAL | Status: AC
  Administered 2023-10-04: 650 mg via ORAL
  Filled 2023-10-04: qty 2

## 2023-10-04 MED ORDER — SODIUM CHLORIDE 0.9 % IV SOLN
50.0000 mg | Freq: Once | INTRAVENOUS | Status: AC
  Administered 2023-10-04: 50 mg via INTRAVENOUS
  Filled 2023-10-04: qty 1

## 2023-10-04 MED ORDER — CETIRIZINE HCL 10 MG/ML IV SOLN
10.0000 mg | Freq: Once | INTRAVENOUS | Status: AC
  Administered 2023-10-04: 10 mg via INTRAVENOUS
  Filled 2023-10-04: qty 1

## 2023-10-04 MED ORDER — METHYLPREDNISOLONE SODIUM SUCC 125 MG IJ SOLR
125.0000 mg | Freq: Once | INTRAMUSCULAR | Status: AC
  Administered 2023-10-04: 125 mg via INTRAVENOUS
  Filled 2023-10-04: qty 2

## 2023-10-04 NOTE — Patient Instructions (Signed)
 [facility] [place] - A DEPT OF [facility]  Discharge Instructions: Thank you for choosing [facility] to provide your oncology and hematology care.  If you have a lab appointment with the Cancer Center - please note that after April 8th, 2024, all labs will be drawn in the cancer center.  You do not have to check in or register with the main entrance as you have in the past but will complete your check-in in the cancer center.  Wear comfortable clothing and clothing appropriate for easy access to any Portacath or PICC line.   We strive to give you quality time with your provider. You may need to reschedule your appointment if you arrive late (15 or more minutes).  Arriving late affects you and other patients whose appointments are after yours.  Also, if you miss three or more appointments without notifying the office, you may be dismissed from the clinic at the provider's discretion.      For prescription refill requests, have your pharmacy contact our office and allow 72 hours for refills to be completed.    Today you received the following:  Infed /Vitamin B12 injection.  Iron  Dextran Injection What is this medication? IRON  DEXTRAN (EYE ern DEX tran) treats low levels of iron  in your body. Iron  is a mineral that plays an important role in making red blood cells, which carry oxygen from your lungs to the rest of your body. This medicine may be used for other purposes; ask your health care provider or pharmacist if you have questions. COMMON BRAND NAME(S): Dexferrum, INFeD  What should I tell my care team before I take this medication? They need to know if you have any of these conditions: Anemia not caused by low iron  levels Heart disease High levels of iron  in the blood Kidney disease Liver disease An unusual or allergic reaction to iron , other medications, foods, dyes, or preservatives Pregnant or trying to get pregnant Breastfeeding How should I  use this medication? This medication is for injection into a vein or a muscle. It is given in a hospital or clinic setting. Talk to your care team about the use of this medication in children. While this medication may be prescribed for children as young as 3 months old for selected conditions, precautions do apply. Overdosage: If you think you have taken too much of this medicine contact a poison control center or emergency room at once. NOTE: This medicine is only for you. Do not share this medicine with others. What if I miss a dose? Keep appointments for follow-up doses. It is important not to miss your dose. Call your care team if you are unable to keep an appointment. What may interact with this medication? Do not take this medication with any of the following: Deferoxamine Dimercaprol Other iron  products This medication may also interact with the following: Chloramphenicol Deferasirox This list may not describe all possible interactions. Give your health care provider a list of all the medicines, herbs, non-prescription drugs, or dietary supplements you use. Also tell them if you smoke, drink alcohol, or use illegal drugs. Some items may interact with your medicine. What should I watch for while using this medication? Visit your care team for regular checks on your progress. Tell your care team if your symptoms do not start to get better or if they get worse. You may need blood work while taking this medication. You may need to eat more foods that contain iron .  Talk to your care team. Foods that contain iron  include whole grains/cereals, dried fruits, beans, peas, leafy green vegetables, and organ meats (liver, kidney). Long-term use of this medication may increase your risk of some cancers. Talk to your care team about your risk of cancer. What side effects may I notice from receiving this medication? Side effects that you should report to your care team as soon as possible: Allergic  reactions--skin rash, itching, hives, swelling of the face, lips, tongue, or throat Low blood pressure--dizziness, feeling faint or lightheaded, blurry vision Shortness of breath Side effects that usually do not require medical attention (report to your care team if they continue or are bothersome): Flushing Headache Joint pain Muscle pain Nausea Pain, redness, or irritation at injection site This list may not describe all possible side effects. Call your doctor for medical advice about side effects. You may report side effects to FDA at 1-800-FDA-1088. Where should I keep my medication? This medication is given in a hospital or clinic. It will not be stored at home. NOTE: This sheet is a summary. It may not cover all possible information. If you have questions about this medicine, talk to your doctor, pharmacist, or health care provider.  2024 Elsevier/Gold Standard (2022-03-16 00:00:00)    Vitamin B12 Injection What is this medication? Vitamin B12 (VAHY tuh min B12) prevents and treats low vitamin B12 levels in your body. It is used in people who do not get enough vitamin B12 from their diet or when their digestive tract does not absorb enough. Vitamin B12 plays an important role in maintaining the health of your nervous system and red blood cells. This medicine may be used for other purposes; ask your health care provider or pharmacist if you have questions. COMMON BRAND NAME(S): B-12 Compliance Kit, B-12 Injection Kit, Cyomin, Dodex , LA-12, Nutri-Twelve, Physicians EZ Use B-12, Primabalt, Vitamin Deficiency Injectable System - B12 What should I tell my care team before I take this medication? They need to know if you have any of these conditions: Kidney disease Leber's disease Megaloblastic anemia An unusual or allergic reaction to cyanocobalamin , cobalt, other medications, foods, dyes, or preservatives Pregnant or trying to get pregnant Breast-feeding How should I use this  medication? This medication is injected into a muscle or deeply under the skin. It is usually given in a clinic or care team's office. However, your care team may teach you how to inject yourself. Follow all instructions. Talk to your care team about the use of this medication in children. Special care may be needed. Overdosage: If you think you have taken too much of this medicine contact a poison control center or emergency room at once. NOTE: This medicine is only for you. Do not share this medicine with others. What if I miss a dose? If you are given your dose at a clinic or care team's office, call to reschedule your appointment. If you give your own injections, and you miss a dose, take it as soon as you can. If it is almost time for your next dose, take only that dose. Do not take double or extra doses. What may interact with this medication? Alcohol Colchicine This list may not describe all possible interactions. Give your health care provider a list of all the medicines, herbs, non-prescription drugs, or dietary supplements you use. Also tell them if you smoke, drink alcohol, or use illegal drugs. Some items may interact with your medicine. What should I watch for while using this medication? Visit your  care team regularly. You may need blood work done while you are taking this medication. You may need to follow a special diet. Talk to your care team. Limit your alcohol intake and avoid smoking to get the best benefit. What side effects may I notice from receiving this medication? Side effects that you should report to your care team as soon as possible: Allergic reactions--skin rash, itching, hives, swelling of the face, lips, tongue, or throat Swelling of the ankles, hands, or feet Trouble breathing Side effects that usually do not require medical attention (report to your care team if they continue or are bothersome): Diarrhea This list may not describe all possible side effects.  Call your doctor for medical advice about side effects. You may report side effects to FDA at 1-800-FDA-1088. Where should I keep my medication? Keep out of the reach of children. Store at room temperature between 15 and 30 degrees C (59 and 85 degrees F). Protect from light. Throw away any unused medication after the expiration date. NOTE: This sheet is a summary. It may not cover all possible information. If you have questions about this medicine, talk to your doctor, pharmacist, or health care provider.  2024 Elsevier/Gold Standard (2021-05-18 00:00:00)     To help prevent nausea and vomiting after your treatment, we encourage you to take your nausea medication as directed.  BELOW ARE SYMPTOMS THAT SHOULD BE REPORTED IMMEDIATELY: *FEVER GREATER THAN 100.4 F (38 C) OR HIGHER *CHILLS OR SWEATING *NAUSEA AND VOMITING THAT IS NOT CONTROLLED WITH YOUR NAUSEA MEDICATION *UNUSUAL SHORTNESS OF BREATH *UNUSUAL BRUISING OR BLEEDING *URINARY PROBLEMS (pain or burning when urinating, or frequent urination) *BOWEL PROBLEMS (unusual diarrhea, constipation, pain near the anus) TENDERNESS IN MOUTH AND THROAT WITH OR WITHOUT PRESENCE OF ULCERS (sore throat, sores in mouth, or a toothache) UNUSUAL RASH, SWELLING OR PAIN  UNUSUAL VAGINAL DISCHARGE OR ITCHING   Items with * indicate a potential emergency and should be followed up as soon as possible or go to the Emergency Department if any problems should occur.  Please show the CHEMOTHERAPY ALERT CARD or IMMUNOTHERAPY ALERT CARD at check-in to the Emergency Department and triage nurse.  Should you have questions after your visit or need to cancel or reschedule your appointment, please contact [facility] [place] - A DEPT OF [facility] [phone]  and follow the prompts.  Office hours are 8:00 a.m. to 4:30 p.m. Monday - Friday. Please note that voicemails left after 4:00 p.m. may not be returned until the following business  day.  We are closed weekends and major holidays. You have access to a nurse at all times for urgent questions. Please call the main number to the clinic [phone] and follow the prompts.  For any non-urgent questions, you may also contact your provider using MyChart. We now offer e-Visits for anyone 18 and older to request care online for non-urgent symptoms. For details visit [email or web address].   Also download the MyChart app! Go to the app store, search "MyChart", open the app, select [facility], and log in with your MyChart username and password.

## 2023-10-04 NOTE — Progress Notes (Signed)
 Patient presents today for iron  infusion.  Patient is in satisfactory condition with no new complaints voiced.  Vital signs are stable.  IV placed in L arm.  IV flushed well with good blood return noted.  We will proceed with infusion per provider orders.    Patient tolerated Vitamin B12 injection with no complaints voiced.  Site clean and dry with no bruising or swelling noted.  No complaints of pain.    Patient tolerated infusion well with no complaints voiced.  Patient left ambulatory in stable condition.  Vital signs stable at discharge.  Follow up as scheduled.

## 2023-10-05 ENCOUNTER — Inpatient Hospital Stay: Payer: Medicare HMO

## 2023-10-05 ENCOUNTER — Telehealth (INDEPENDENT_AMBULATORY_CARE_PROVIDER_SITE_OTHER): Payer: Medicare HMO | Admitting: Surgery

## 2023-10-05 DIAGNOSIS — D509 Iron deficiency anemia, unspecified: Secondary | ICD-10-CM

## 2023-10-05 DIAGNOSIS — K8020 Calculus of gallbladder without cholecystitis without obstruction: Secondary | ICD-10-CM | POA: Diagnosis not present

## 2023-10-05 NOTE — Telephone Encounter (Signed)
[facility]  Called and spoke with the patient regarding her recent evaluation with cardiology and hematology.  Her cardiac workup was negative for any acute issues, so she is good to undergo surgery from a cardiac standpoint.  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  She received an iron transfusion and will be receiving B12 injections.  They recommended blood work prior to any surgeries to check platelets and Hgb level.  After discussing this with the patient, she stated that she has had no further issues with right upper quadrant abdominal pain or pain under her right breast.  She is tolerating her diet without nausea and vomiting, and she is tolerating fatty foods without issue.  We discussed that we can hold off on cholecystectomy at this time if the is asymptomatic, but she should call if she begins to have abdominal pain after eating, nausea, or vomiting.  She does have cholelithiasis and may require interventions in the future is she has any further symptoms.  This is a billable encounter.  Please call with any questions or concerns.  Theophilus Kinds, DO [facility] [address] Vella Raring [place], [place] [address] [phone] (office)

## 2023-10-10 LAB — UPEP/UIFE/LIGHT CHAINS/TP, 24-HR UR
% BETA, Urine: 0 %
ALPHA 1 URINE: 0 %
Albumin, U: 100 %
Alpha 2, Urine: 0 %
Free Kappa Lt Chains,Ur: 6.12 mg/L (ref 1.17–86.46)
Free Kappa/Lambda Ratio: 1.02 — ABNORMAL LOW (ref 1.83–14.26)
Free Lambda Lt Chains,Ur: 5.98 mg/L (ref 0.27–15.21)
GAMMA GLOBULIN URINE: 0 %
Total Protein, Urine-Ur/day: 81 mg/(24.h) (ref 30–150)
Total Protein, Urine: 12.4 mg/dL
Total Volume: 650

## 2023-10-12 ENCOUNTER — Inpatient Hospital Stay: Payer: Medicare HMO

## 2023-10-12 VITALS — BP 122/77 | HR 71 | Temp 97.1°F | Resp 20

## 2023-10-12 DIAGNOSIS — E611 Iron deficiency: Secondary | ICD-10-CM | POA: Diagnosis not present

## 2023-10-12 DIAGNOSIS — D509 Iron deficiency anemia, unspecified: Secondary | ICD-10-CM

## 2023-10-12 MED ORDER — CYANOCOBALAMIN 1000 MCG/ML IJ SOLN
1000.0000 ug | Freq: Once | INTRAMUSCULAR | Status: AC
  Administered 2023-10-12: 1000 ug via INTRAMUSCULAR
  Filled 2023-10-12: qty 1

## 2023-10-12 NOTE — Progress Notes (Signed)
Victoria Adkins presents today for injection per the provider's orders. B12 administration without incident; injection site WNL; see MAR for injection details.  Patient tolerated procedure well and without incident.  No questions or complaints noted at this time.   Patient c/o severe dizziness when standing yesterday she stated it did get better at bedtime. She stated she was afraid she was going to fall. Today in the parking lot she c/o dizziness. Durenda Hurt, NP made aware and recommended patient to call her PCP for further evaluation. Patient made aware and verbalized understanding.

## 2023-10-12 NOTE — Patient Instructions (Signed)
 [facility] [place] [facility]  Discharge Instructions: Thank you for choosing [facility] to provide your oncology and hematology care.  If you have a lab appointment with the Cancer Center - please note that after April 8th, 2024, all labs will be drawn in the cancer center.  You do not have to check in or register with the main entrance as you have in the past but will complete your check-in in the cancer center.  Wear comfortable clothing and clothing appropriate for easy access to any Portacath or PICC line.   We strive to give you quality time with your provider. You may need to reschedule your appointment if you arrive late (15 or more minutes).  Arriving late affects you and other patients whose appointments are after yours.  Also, if you miss three or more appointments without notifying the office, you may be dismissed from the clinic at the provider's discretion.      For prescription refill requests, have your pharmacy contact our office and allow 72 hours for refills to be completed.    Today you received B12 injection     BELOW ARE SYMPTOMS THAT SHOULD BE REPORTED IMMEDIATELY: *FEVER GREATER THAN 100.4 F (38 C) OR HIGHER *CHILLS OR SWEATING *NAUSEA AND VOMITING THAT IS NOT CONTROLLED WITH YOUR NAUSEA MEDICATION *UNUSUAL SHORTNESS OF BREATH *UNUSUAL BRUISING OR BLEEDING *URINARY PROBLEMS (pain or burning when urinating, or frequent urination) *BOWEL PROBLEMS (unusual diarrhea, constipation, pain near the anus) TENDERNESS IN MOUTH AND THROAT WITH OR WITHOUT PRESENCE OF ULCERS (sore throat, sores in mouth, or a toothache) UNUSUAL RASH, SWELLING OR PAIN  UNUSUAL VAGINAL DISCHARGE OR ITCHING   Items with * indicate a potential emergency and should be followed up as soon as possible or go to the Emergency Department if any problems should occur.  Please show the CHEMOTHERAPY ALERT CARD or IMMUNOTHERAPY ALERT CARD at check-in to  the Emergency Department and triage nurse.  Should you have questions after your visit or need to cancel or reschedule your appointment, please contact [facility] [place] - A DEPT OF Eligha Bridegroom [facility] [phone]  and follow the prompts.  Office hours are 8:00 a.m. to 4:30 p.m. Monday - Friday. Please note that voicemails left after 4:00 p.m. may not be returned until the following business day.  We are closed weekends and major holidays. You have access to a nurse at all times for urgent questions. Please call the main number to the clinic [phone] and follow the prompts.  For any non-urgent questions, you may also contact your provider using MyChart. We now offer e-Visits for anyone 57 and older to request care online for non-urgent symptoms. For details visit [email or web address].   Also download the MyChart app! Go to the app store, search "MyChart", open the app, select [place], and log in with your MyChart username and password.

## 2023-10-19 ENCOUNTER — Inpatient Hospital Stay: Payer: Medicare HMO

## 2023-10-19 DIAGNOSIS — E611 Iron deficiency: Secondary | ICD-10-CM | POA: Diagnosis not present

## 2023-10-19 DIAGNOSIS — D509 Iron deficiency anemia, unspecified: Secondary | ICD-10-CM

## 2023-10-19 MED ORDER — CYANOCOBALAMIN 1000 MCG/ML IJ SOLN
1000.0000 ug | Freq: Once | INTRAMUSCULAR | Status: AC
  Administered 2023-10-19: 1000 ug via INTRAMUSCULAR
  Filled 2023-10-19: qty 1

## 2023-10-19 MED ORDER — CYANOCOBALAMIN 1000 MCG/ML IJ SOLN: 1000.0000 ug | Freq: Once | INTRAMUSCULAR | Status: DC

## 2023-10-19 NOTE — Patient Instructions (Signed)
[facility] [place] [facility]  Discharge Instructions: Thank you for choosing [facility] to provide your oncology and hematology care.  If you have a lab appointment with the Cancer Center - please note that after April 8th, 2024, all labs will be drawn in the cancer center.  You do not have to check in or register with the main entrance as you have in the past but will complete your check-in in the cancer center.  Wear comfortable clothing and clothing appropriate for easy access to any Portacath or PICC line.   We strive to give you quality time with your provider. You may need to reschedule your appointment if you arrive late (15 or more minutes).  Arriving late affects you and other patients whose appointments are after yours.  Also, if you miss three or more appointments without notifying the office, you may be dismissed from the clinic at the provider's discretion.      For prescription refill requests, have your pharmacy contact our office and allow 72 hours for refills to be completed.    Today you received the following injection: B12   To help prevent nausea and vomiting after your treatment, we encourage you to take your nausea medication as directed.  BELOW ARE SYMPTOMS THAT SHOULD BE REPORTED IMMEDIATELY: *FEVER GREATER THAN 100.4 F (38 C) OR HIGHER *CHILLS OR SWEATING *NAUSEA AND VOMITING THAT IS NOT CONTROLLED WITH YOUR NAUSEA MEDICATION *UNUSUAL SHORTNESS OF BREATH *UNUSUAL BRUISING OR BLEEDING *URINARY PROBLEMS (pain or burning when urinating, or frequent urination) *BOWEL PROBLEMS (unusual diarrhea, constipation, pain near the anus) TENDERNESS IN MOUTH AND THROAT WITH OR WITHOUT PRESENCE OF ULCERS (sore throat, sores in mouth, or a toothache) UNUSUAL RASH, SWELLING OR PAIN  UNUSUAL VAGINAL DISCHARGE OR ITCHING   Items with * indicate a potential emergency and should be followed up as soon as possible or go to the  Emergency Department if any problems should occur.  Please show the CHEMOTHERAPY ALERT CARD or IMMUNOTHERAPY ALERT CARD at check-in to the Emergency Department and triage nurse.  Should you have questions after your visit or need to cancel or reschedule your appointment, please contact [facility] [place] - A DEPT OF [facility] [phone]  and follow the prompts.  Office hours are 8:00 a.m. to 4:30 p.m. Monday - Friday. Please note that voicemails left after 4:00 p.m. may not be returned until the following business day.  We are closed weekends and major holidays. You have access to a nurse at all times for urgent questions. Please call the main number to the clinic [phone] and follow the prompts.  For any non-urgent questions, you may also contact your provider using MyChart. We now offer e-Visits for anyone 73 and older to request care online for non-urgent symptoms. For details visit [email or web address].   Also download the MyChart app! Go to the app store, search "MyChart", open the app, select [place], and log in with your MyChart username and password.

## 2023-10-19 NOTE — Progress Notes (Signed)
B12 injection  given per orders. Patient tolerated it well without problems. Vitals stable and discharged home from clinic ambulatory. Follow up as scheduled.

## 2023-10-26 ENCOUNTER — Inpatient Hospital Stay: Payer: Medicare HMO | Attending: Hematology

## 2023-10-26 VITALS — BP 109/66 | HR 66 | Temp 97.8°F | Resp 18

## 2023-10-26 DIAGNOSIS — E538 Deficiency of other specified B group vitamins: Secondary | ICD-10-CM | POA: Diagnosis present

## 2023-10-26 DIAGNOSIS — D509 Iron deficiency anemia, unspecified: Secondary | ICD-10-CM

## 2023-10-26 DIAGNOSIS — E611 Iron deficiency: Secondary | ICD-10-CM | POA: Diagnosis present

## 2023-10-26 MED ORDER — CYANOCOBALAMIN 1000 MCG/ML IJ SOLN
1000.0000 ug | Freq: Once | INTRAMUSCULAR | Status: AC
  Administered 2023-10-26: 1000 ug via INTRAMUSCULAR
  Filled 2023-10-26: qty 1

## 2023-10-26 NOTE — Progress Notes (Signed)
 Victoria Adkins presents today for injection per the provider's orders.  B12 administration without incident; injection site WNL; see MAR for injection details.  Patient tolerated procedure well and without incident.  No questions or complaints noted at this time.   Patient c/o continuous dizziness and weakness. Patient also stated B12 injections are not working. Delon Hope, NP made aware and stated to bring patient in labs and follow-up appointment earlier than scheduled. Scheduler made aware and patient will be informed.  Discharged from clinic ambulatory in stable condition. Alert and oriented x 3. F/U with [facility] as scheduled.

## 2023-10-26 NOTE — Patient Instructions (Signed)
 [facility] [place] [facility]  Discharge Instructions: Thank you for choosing [facility] to provide your oncology and hematology care.  If you have a lab appointment with the Cancer Center - please note that after April 8th, 2024, all labs will be drawn in the cancer center.  You do not have to check in or register with the main entrance as you have in the past but will complete your check-in in the cancer center.  Wear comfortable clothing and clothing appropriate for easy access to any Portacath or PICC line.   We strive to give you quality time with your provider. You may need to reschedule your appointment if you arrive late (15 or more minutes).  Arriving late affects you and other patients whose appointments are after yours.  Also, if you miss three or more appointments without notifying the office, you may be dismissed from the clinic at the provider's discretion.      For prescription refill requests, have your pharmacy contact our office and allow 72 hours for refills to be completed.    Today you received B12 injection     BELOW ARE SYMPTOMS THAT SHOULD BE REPORTED IMMEDIATELY: *FEVER GREATER THAN 100.4 F (38 C) OR HIGHER *CHILLS OR SWEATING *NAUSEA AND VOMITING THAT IS NOT CONTROLLED WITH YOUR NAUSEA MEDICATION *UNUSUAL SHORTNESS OF BREATH *UNUSUAL BRUISING OR BLEEDING *URINARY PROBLEMS (pain or burning when urinating, or frequent urination) *BOWEL PROBLEMS (unusual diarrhea, constipation, pain near the anus) TENDERNESS IN MOUTH AND THROAT WITH OR WITHOUT PRESENCE OF ULCERS (sore throat, sores in mouth, or a toothache) UNUSUAL RASH, SWELLING OR PAIN  UNUSUAL VAGINAL DISCHARGE OR ITCHING   Items with * indicate a potential emergency and should be followed up as soon as possible or go to the Emergency Department if any problems should occur.  Please show the CHEMOTHERAPY ALERT CARD or IMMUNOTHERAPY ALERT CARD at check-in to  the Emergency Department and triage nurse.  Should you have questions after your visit or need to cancel or reschedule your appointment, please contact [facility] [place] - A DEPT OF Eligha Bridegroom [facility] [phone]  and follow the prompts.  Office hours are 8:00 a.m. to 4:30 p.m. Monday - Friday. Please note that voicemails left after 4:00 p.m. may not be returned until the following business day.  We are closed weekends and major holidays. You have access to a nurse at all times for urgent questions. Please call the main number to the clinic [phone] and follow the prompts.  For any non-urgent questions, you may also contact your provider using MyChart. We now offer e-Visits for anyone 57 and older to request care online for non-urgent symptoms. For details visit [email or web address].   Also download the MyChart app! Go to the app store, search "MyChart", open the app, select [place], and log in with your MyChart username and password.

## 2023-10-31 ENCOUNTER — Other Ambulatory Visit (HOSPITAL_COMMUNITY): Payer: Medicare HMO

## 2023-11-07 ENCOUNTER — Encounter: Payer: Self-pay | Admitting: Internal Medicine

## 2023-11-07 ENCOUNTER — Ambulatory Visit: Payer: Medicare HMO | Admitting: Psychiatry

## 2023-11-21 ENCOUNTER — Other Ambulatory Visit: Payer: Self-pay

## 2023-11-21 DIAGNOSIS — D696 Thrombocytopenia, unspecified: Secondary | ICD-10-CM

## 2023-11-21 DIAGNOSIS — D472 Monoclonal gammopathy: Secondary | ICD-10-CM

## 2023-11-21 DIAGNOSIS — D509 Iron deficiency anemia, unspecified: Secondary | ICD-10-CM

## 2023-11-23 ENCOUNTER — Inpatient Hospital Stay: Payer: Medicare HMO | Attending: Hematology

## 2023-11-23 ENCOUNTER — Inpatient Hospital Stay: Payer: Medicare HMO

## 2023-11-23 ENCOUNTER — Inpatient Hospital Stay: Payer: Medicare HMO | Admitting: Oncology

## 2023-11-23 VITALS — BP 132/75 | HR 66 | Temp 99.0°F | Resp 18 | Ht 66.0 in | Wt 201.0 lb

## 2023-11-23 DIAGNOSIS — F32A Depression, unspecified: Secondary | ICD-10-CM | POA: Insufficient documentation

## 2023-11-23 DIAGNOSIS — R42 Dizziness and giddiness: Secondary | ICD-10-CM | POA: Insufficient documentation

## 2023-11-23 DIAGNOSIS — D509 Iron deficiency anemia, unspecified: Secondary | ICD-10-CM | POA: Insufficient documentation

## 2023-11-23 DIAGNOSIS — E538 Deficiency of other specified B group vitamins: Secondary | ICD-10-CM | POA: Insufficient documentation

## 2023-11-23 DIAGNOSIS — D472 Monoclonal gammopathy: Secondary | ICD-10-CM | POA: Insufficient documentation

## 2023-11-23 DIAGNOSIS — Z87891 Personal history of nicotine dependence: Secondary | ICD-10-CM | POA: Insufficient documentation

## 2023-11-23 DIAGNOSIS — D696 Thrombocytopenia, unspecified: Secondary | ICD-10-CM | POA: Insufficient documentation

## 2023-11-23 DIAGNOSIS — Z808 Family history of malignant neoplasm of other organs or systems: Secondary | ICD-10-CM | POA: Diagnosis not present

## 2023-11-23 DIAGNOSIS — R0602 Shortness of breath: Secondary | ICD-10-CM | POA: Diagnosis not present

## 2023-11-23 DIAGNOSIS — J449 Chronic obstructive pulmonary disease, unspecified: Secondary | ICD-10-CM | POA: Insufficient documentation

## 2023-11-23 LAB — CBC WITH DIFFERENTIAL/PLATELET
Abs Immature Granulocytes: 0.04 10*3/uL (ref 0.00–0.07)
Basophils Absolute: 0.1 10*3/uL (ref 0.0–0.1)
Basophils Relative: 1 %
Eosinophils Absolute: 0.3 10*3/uL (ref 0.0–0.5)
Eosinophils Relative: 3 %
HCT: 41.4 % (ref 36.0–46.0)
Hemoglobin: 13.4 g/dL (ref 12.0–15.0)
Immature Granulocytes: 1 %
Lymphocytes Relative: 20 %
Lymphs Abs: 1.7 10*3/uL (ref 0.7–4.0)
MCH: 28.3 pg (ref 26.0–34.0)
MCHC: 32.4 g/dL (ref 30.0–36.0)
MCV: 87.5 fL (ref 80.0–100.0)
Monocytes Absolute: 0.5 10*3/uL (ref 0.1–1.0)
Monocytes Relative: 6 %
Neutro Abs: 6.1 10*3/uL (ref 1.7–7.7)
Neutrophils Relative %: 69 %
Platelets: 166 10*3/uL (ref 150–400)
RBC: 4.73 MIL/uL (ref 3.87–5.11)
RDW: 15.7 % — ABNORMAL HIGH (ref 11.5–15.5)
WBC: 8.8 10*3/uL (ref 4.0–10.5)
nRBC: 0 % (ref 0.0–0.2)

## 2023-11-23 LAB — LACTATE DEHYDROGENASE: LDH: 153 U/L (ref 98–192)

## 2023-11-23 LAB — FERRITIN: Ferritin: 100 ng/mL (ref 11–307)

## 2023-11-23 LAB — IRON AND TIBC
Iron: 89 ug/dL (ref 28–170)
Saturation Ratios: 30 % (ref 10.4–31.8)
TIBC: 294 ug/dL (ref 250–450)
UIBC: 205 ug/dL

## 2023-11-23 MED ORDER — CYANOCOBALAMIN 1000 MCG/ML IJ SOLN
1000.0000 ug | Freq: Once | INTRAMUSCULAR | Status: AC
  Administered 2023-11-23: 1000 ug via INTRAMUSCULAR
  Filled 2023-11-23: qty 1

## 2023-11-23 NOTE — Patient Instructions (Signed)
 [facility] [place] [facility]  Discharge Instructions: Thank you for choosing [facility] to provide your oncology and hematology care.  If you have a lab appointment with the Cancer Center - please note that after April 8th, 2024, all labs will be drawn in the cancer center.  You do not have to check in or register with the main entrance as you have in the past but will complete your check-in in the cancer center.  Wear comfortable clothing and clothing appropriate for easy access to any Portacath or PICC line.   We strive to give you quality time with your provider. You may need to reschedule your appointment if you arrive late (15 or more minutes).  Arriving late affects you and other patients whose appointments are after yours.  Also, if you miss three or more appointments without notifying the office, you may be dismissed from the clinic at the provider's discretion.      For prescription refill requests, have your pharmacy contact our office and allow 72 hours for refills to be completed.    Today you received the following B12, return as scheduled.   To help prevent nausea and vomiting after your treatment, we encourage you to take your nausea medication as directed.  BELOW ARE SYMPTOMS THAT SHOULD BE REPORTED IMMEDIATELY: *FEVER GREATER THAN 100.4 F (38 C) OR HIGHER *CHILLS OR SWEATING *NAUSEA AND VOMITING THAT IS NOT CONTROLLED WITH YOUR NAUSEA MEDICATION *UNUSUAL SHORTNESS OF BREATH *UNUSUAL BRUISING OR BLEEDING *URINARY PROBLEMS (pain or burning when urinating, or frequent urination) *BOWEL PROBLEMS (unusual diarrhea, constipation, pain near the anus) TENDERNESS IN MOUTH AND THROAT WITH OR WITHOUT PRESENCE OF ULCERS (sore throat, sores in mouth, or a toothache) UNUSUAL RASH, SWELLING OR PAIN  UNUSUAL VAGINAL DISCHARGE OR ITCHING   Items with * indicate a potential emergency and should be followed up as soon as possible or go  to the Emergency Department if any problems should occur.  Please show the CHEMOTHERAPY ALERT CARD or IMMUNOTHERAPY ALERT CARD at check-in to the Emergency Department and triage nurse.  Should you have questions after your visit or need to cancel or reschedule your appointment, please contact [facility] [place] - A DEPT OF Eligha Bridegroom [facility] [phone]  and follow the prompts.  Office hours are 8:00 a.m. to 4:30 p.m. Monday - Friday. Please note that voicemails left after 4:00 p.m. may not be returned until the following business day.  We are closed weekends and major holidays. You have access to a nurse at all times for urgent questions. Please call the main number to the clinic [phone] and follow the prompts.  For any non-urgent questions, you may also contact your provider using MyChart. We now offer e-Visits for anyone 8 and older to request care online for non-urgent symptoms. For details visit [email or web address].   Also download the MyChart app! Go to the app store, search "MyChart", open the app, select [place], and log in with your MyChart username and password.

## 2023-11-23 NOTE — Progress Notes (Signed)
 Patient tolerated injection with no complaints voiced. Site clean and dry with no bruising or swelling noted at site. See MAR for details. Band aid applied.  Patient stable during and after injection. VSS with discharge and left in satisfactory condition with no s/s of distress noted.

## 2023-11-23 NOTE — Progress Notes (Signed)
 [facility] [address], [place] [address]   Clinic Day:  11/23/2023  Referring physician: Ignatius Specking, MD  Patient Care Team: Ignatius Specking, MD as PCP - General (Internal Medicine) Jonelle Sidle, MD as PCP - Cardiology (Cardiology)   ASSESSMENT & PLAN:   Assessment:  1.  Mild thrombocytopenia: - Patient seen at the request of Dr. Sherril Croon - CBC on 08/01/2023, PLT-[id number] with platelet clumps noted - CBC (08/10/2023): PLT-107 - Reports easy bruising on the forearms and hands for several years.  No active bleeding. - Ultrasound abdomen (08/10/2023): Spleen normal in size and appearance.  Hepatic steatosis.  Cholelithiasis.  2.  Social/family history: - Lives at home by herself and is independent of ADLs and IADLs.  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  No chemical/pesticide exposure more than usual.  Quit smoking 12 years ago.  Smoked 1 pack/day for 20 years. - Maternal aunt had breast cancer.  Mother died of brain tumor.  Maternal uncle died of brain tumor.   Plan: 1. Thrombocytopenia (HCC) (Primary) -Platelet counts have ranged from 120 to normal since 2011. -Workup did not reveal any evidence of chronic inflammation, active tissue disorder or infectious etiology with negative or nonreactive hep B, hep C and HIV. -Most recent labs from 11/23/2023 show a platelet count of 166.  Will continue to monitor.  2. Iron deficiency anemia, unspecified iron deficiency anemia type -She received 1 dose of INFeD on 10/04/2023 along with weekly B12 x 4.  Last dose given on 10/26/2023. -Tolerated iron infusion well. -Repeat labs from 11/23/2023 show hemoglobin of 13.4 (11.6), 100 (7), iron saturation 30% (9%) and normal TIBC. - No additional IV iron needed at this time.  Recommend she continue oral iron supplements  3. MGUS (monoclonal gammopathy of unknown significance) -Found incidentally during workup for anemia and thrombocytopenia. -SPEP with M spike of 0.2.   Immunofixation shows IgG monoclonal protein with lambda light chain specificity.  Kappa lambda light chain ratio normal. -Bone scan from 10/04/2023 did not reveal evidence of elevated heart or lesions. -UPEP did not reveal evidence of monoclonal protein.  Monoclonal protein. -No crab criteria-13.4, creatinine normal and calcium 0.2.  No new bone pain. -Will continue to monitor this every 6 months and bone scan annually.   PLAN SUMMARY: >>Monthly B12 injections.  >> No additional IV iron needed. >> RTC in 3 months with lab, B12 and NP.        Orders Placed This Encounter  Procedures   CBC with Differential    Standing Status:   Future    Expected Date:   02/23/2024    Expiration Date:   11/22/2024   Ferritin    Standing Status:   Future    Expected Date:   02/23/2024    Expiration Date:   11/22/2024   Iron and TIBC ([facility] ONLY)    Standing Status:   Future    Expected Date:   02/23/2024    Expiration Date:   11/22/2024    Mauro Kaufmann, NP   3/6/202511:32 AM  CHIEF COMPLAINT/PURPOSE OF CONSULT:   Diagnosis: Mild thrombocytopenia  Current Therapy: IDA, MGUS  HISTORY OF PRESENT ILLNESS:   Victoria Adkins is a 73 y.o. female presenting to clinic today for follow-up for MGUS, iron deficiency anemia and thrombocytopenia.  She was last seen by me on 09/27/23.   In the interim, she denies any hospitalizations, surgeries or changes to her baseline health.  She has  been receiving monthly B12 injections which she feels like have been helping her energy level some.  Reports appetite of 100% energy levels of 40%.  Pain to right thumb secondary to having a cyst removed on 227 5.  She has follow-up for this after her visit today.  She is hoping to have her wrap removed.  Reports she has been taking iron supplements 2/day for the last few weeks.  Denies any constipation or GI upset.  Patient has intermittent cough and shortness of breath but this is chronic for her in [place] to COPD.   Has underlying anxiety and depression.  Has trouble staying asleep secondary to her thumb.  Has occasional dizziness.  No hematochezia.    PAST MEDICAL HISTORY:   Past Medical History: Past Medical History:  Diagnosis Date   Anxiety    Arthritis    COPD (chronic obstructive pulmonary disease) (HCC)    Depression    Dyspnea    History of nuclear stress test    Myoview 10/02/2023: Inferoseptal defect consistent with soft tissue attenuation, basal lateral defect partially reversible (soft tissue attenuation versus minor ischemic territory); EF 59; low risk   Hyperlipidemia    Iron deficiency anemia 09/27/2023    Surgical History: Past Surgical History:  Procedure Laterality Date   CATARACT EXTRACTION W/PHACO Left 04/20/2018   Procedure: CATARACT EXTRACTION PHACO AND INTRAOCULAR LENS PLACEMENT (IOC);  Surgeon: Fabio Pierce, MD;  Location: [facility];  Service: Ophthalmology;  Laterality: Left;  CDE: 4.71   CATARACT EXTRACTION W/PHACO Right 05/04/2018   Procedure: CATARACT EXTRACTION PHACO AND INTRAOCULAR LENS PLACEMENT (IOC);  Surgeon: Fabio Pierce, MD;  Location: [facility];  Service: Ophthalmology;  Laterality: Right;  CDE: 5.56   CORRECTION HAMMER TOE Bilateral    REPLACEMENT TOTAL KNEE Right 2011   VAGINAL HYSTERECTOMY      Social History: Social History   Socioeconomic History   Marital status: Divorced    Spouse name: Not on file   Number of children: 1   Years of education: Not on file   Highest education level: High school graduate  Occupational History   Not on file  Tobacco Use   Smoking status: Former    Current packs/day: 0.00    Average packs/day: 1 pack/day for 20.0 years (20.0 ttl pk-yrs)    Types: Cigarettes    Start date: 09/20/1987    Quit date: 09/20/2007    Years since quitting: 16.1   Smokeless tobacco: Never  Vaping Use   Vaping status: Never Used  Substance and Sexual Activity   Alcohol use: Yes    Comment: Occasional   Drug use: No   Sexual activity:  Not on file  Other Topics Concern   Not on file  Social History Narrative   Not on file   Social Drivers of Health   Financial Resource Strain: Low Risk  (04/12/2018)   Overall Financial Resource Strain (CARDIA)    Difficulty of Paying Living Expenses: Not hard at all  Food Insecurity: No Food Insecurity (09/04/2023)   Hunger Vital Sign    Worried About Running Out of Food in the Last Year: Never true    Ran Out of Food in the Last Year: Never true  Transportation Needs: No Transportation Needs (09/04/2023)   PRAPARE - Administrator, Civil Service (Medical): No    Lack of Transportation (Non-Medical): No  Physical Activity: Insufficiently Active (03/06/2018)   Received from [facility], [facility]   Exercise Vital Sign  Days of Exercise per Week: 2 days    Minutes of Exercise per Session: 30 min  Stress: Stress Concern Present (03/06/2018)   Received from [facility], [facility]   [facility] Occupational [facility] - Occupational Stress Questionnaire    Feeling of Stress : Rather much  Social Connections: Not on file  Intimate Partner Violence: Not At Risk (09/04/2023)   Humiliation, Afraid, Rape, and Kick questionnaire    Fear of Current or Ex-Partner: No    Emotionally Abused: No    Physically Abused: No    Sexually Abused: No    Family History: Family History  Problem Relation Age of Onset   Brain cancer Mother    Dementia Father    Emphysema Father    Seizures Sister    Blindness Sister    Alcohol abuse Maternal Grandfather    Breast cancer Neg Hx     Current Medications:  Current Outpatient Medications:    Blood Pressure Monitor KIT, 1 each by Does not apply route as directed. Dx: diastolic dysfunction, Disp: 1 kit, Rfl: 0   BREZTRI AEROSPHERE 160-9-4.8 MCG/ACT AERO, Inhale into the lungs as needed., Disp: , Rfl:    diclofenac (VOLTAREN) 75 MG EC tablet, Take 75 mg by mouth 2 (two) times daily., Disp: , Rfl:    furosemide  (LASIX) 20 MG tablet, Take 1 tablet (20 mg total) by mouth daily as needed (swelling or 2-3 lbs weight gain in 24 hours)., Disp: 30 tablet, Rfl: 2   glycopyrrolate (ROBINUL) 1 MG tablet, Take 1 mg by mouth 2 (two) times daily., Disp: , Rfl:    latanoprost (XALATAN) 0.005 % ophthalmic solution, Place 1 drop into both eyes at bedtime., Disp: , Rfl:    meclizine (ANTIVERT) 25 MG tablet, Take 25 mg by mouth 3 (three) times daily as needed., Disp: , Rfl:    memantine (NAMENDA) 5 MG tablet, Take 5 mg by mouth daily., Disp: , Rfl:    oxyCODONE (OXY IR/ROXICODONE) 5 MG immediate release tablet, Take 1 tablet every 4-6 hours by oral route as needed for pain for 7 days., Disp: , Rfl:    pregabalin (LYRICA) 100 MG capsule, Take 100 mg by mouth daily., Disp: , Rfl:    propranolol (INDERAL) 10 MG tablet, Take 10 mg by mouth at bedtime., Disp: , Rfl:    sertraline (ZOLOFT) 100 MG tablet, Take 100 mg by mouth 2 (two) times daily., Disp: , Rfl:    simvastatin (ZOCOR) 40 MG tablet, Take 40 mg by mouth at bedtime., Disp: , Rfl:    temazepam (RESTORIL) 30 MG capsule, Take 1 capsule by mouth at bedtime as needed., Disp: , Rfl:    Allergies: No Known Allergies  REVIEW OF SYSTEMS:   Review of Systems  Respiratory:  Positive for shortness of breath.   Neurological:  Positive for dizziness and numbness.  Psychiatric/Behavioral:  Positive for depression. The patient is nervous/anxious.   All other systems reviewed and are negative.    VITALS:   Blood pressure 132/75, pulse 66, temperature 99 F (37.2 C), temperature source Tympanic, resp. rate 18, height 5\' 6"  (1.676 m), weight 201 lb (91.2 kg), SpO2 96%.  Wt Readings from Last 3 Encounters:  11/23/23 201 lb (91.2 kg)  09/27/23 204 lb 12.9 oz (92.9 kg)  09/04/23 208 lb 3.2 oz (94.4 kg)    Body mass index is 32.44 kg/m.   PHYSICAL EXAM:   Physical Exam Constitutional:      Appearance: Normal  appearance.  Cardiovascular:     Rate and Rhythm: Normal  rate and regular rhythm.  Pulmonary:     Effort: Pulmonary effort is normal.     Breath sounds: Normal breath sounds.  Abdominal:     General: Bowel sounds are normal.     Palpations: Abdomen is soft.  Musculoskeletal:        General: No swelling. Normal range of motion.  Neurological:     Mental Status: She is alert and oriented to person, place, and time. Mental status is at baseline.     LABS:      Latest Ref Rng & Units 11/23/2023    9:08 AM 09/04/2023    2:05 PM 03/16/2021    3:07 PM  CBC  WBC 4.0 - 10.5 K/uL 8.8  10.1  9.5   Hemoglobin 12.0 - 15.0 g/dL 03.4  74.2  6.9   Hematocrit 36.0 - 46.0 % 41.4  37.7  25.9   Platelets 150 - 400 K/uL 166  120  165       Latest Ref Rng & Units 03/16/2021    3:07 PM 06/26/2018    2:39 PM 04/13/2018   11:24 AM  CMP  Glucose 70 - 99 mg/dL 99  89  91   BUN 8 - 23 mg/dL 21  20  15    Creatinine 0.44 - 1.00 mg/dL 5.95  6.38  7.56   Sodium 135 - 145 mmol/L 137  136  139   Potassium 3.5 - 5.1 mmol/L 4.1  4.0  4.4   Chloride 98 - 111 mmol/L 104  99  104   CO2 22 - 32 mmol/L 26  30  27    Calcium 8.9 - 10.3 mg/dL 8.9  9.7  9.2      No results found for: "CEA1", "CEA" / No results found for: "CEA1", "CEA" No results found for: "PSA1" No results found for: "CAN199" No results found for: "CAN125"  Lab Results  Component Value Date   TOTALPROTELP 6.7 09/04/2023   ALBUMINELP 4.0 09/04/2023   A1GS 0.2 09/04/2023   A2GS 0.7 09/04/2023   BETS 1.0 09/04/2023   GAMS 0.6 09/04/2023   MSPIKE 0.2 (H) 09/04/2023   SPEI Comment 09/04/2023   Lab Results  Component Value Date   TIBC 294 11/23/2023   TIBC 431 09/04/2023   FERRITIN 100 11/23/2023   FERRITIN 7 (L) 09/04/2023   IRONPCTSAT 30 11/23/2023   IRONPCTSAT 9 (L) 09/04/2023   Lab Results  Component Value Date   LDH 153 11/23/2023   LDH 168 09/04/2023     STUDIES:   No results found.

## 2023-11-24 LAB — KAPPA/LAMBDA LIGHT CHAINS
Kappa free light chain: 24.4 mg/L — ABNORMAL HIGH (ref 3.3–19.4)
Kappa, lambda light chain ratio: 1.37 (ref 0.26–1.65)
Lambda free light chains: 17.8 mg/L (ref 5.7–26.3)

## 2023-11-27 LAB — PROTEIN ELECTROPHORESIS, SERUM
A/G Ratio: 1.5 (ref 0.7–1.7)
Albumin ELP: 4 g/dL (ref 2.9–4.4)
Alpha-1-Globulin: 0.2 g/dL (ref 0.0–0.4)
Alpha-2-Globulin: 0.8 g/dL (ref 0.4–1.0)
Beta Globulin: 1 g/dL (ref 0.7–1.3)
Gamma Globulin: 0.6 g/dL (ref 0.4–1.8)
Globulin, Total: 2.7 g/dL (ref 2.2–3.9)
M-Spike, %: 0.2 g/dL — ABNORMAL HIGH
Total Protein ELP: 6.7 g/dL (ref 6.0–8.5)

## 2023-12-25 ENCOUNTER — Inpatient Hospital Stay: Payer: Medicare HMO

## 2023-12-26 ENCOUNTER — Inpatient Hospital Stay: Attending: Hematology

## 2023-12-26 VITALS — BP 117/63 | HR 60 | Temp 97.9°F | Resp 18

## 2023-12-26 DIAGNOSIS — E538 Deficiency of other specified B group vitamins: Secondary | ICD-10-CM | POA: Insufficient documentation

## 2023-12-26 DIAGNOSIS — D509 Iron deficiency anemia, unspecified: Secondary | ICD-10-CM

## 2023-12-26 MED ORDER — CYANOCOBALAMIN 1000 MCG/ML IJ SOLN
1000.0000 ug | Freq: Once | INTRAMUSCULAR | Status: AC
  Administered 2023-12-26: 1000 ug via INTRAMUSCULAR
  Filled 2023-12-26: qty 1

## 2023-12-26 NOTE — Progress Notes (Signed)
 Patient tolerated B12 injection with no complaints voiced.  Site clean and dry with no bruising or swelling noted at site.  See MAR for details.  Band aid applied.  Patient stable during and after injection.  Vss with discharge and left in satisfactory condition with no s/s of distress noted. All follow ups as scheduled.   Victoria Adkins [employer]

## 2024-01-18 ENCOUNTER — Other Ambulatory Visit: Payer: Medicare HMO

## 2024-01-25 ENCOUNTER — Ambulatory Visit: Payer: Medicare HMO | Admitting: Oncology

## 2024-01-25 ENCOUNTER — Inpatient Hospital Stay: Payer: Medicare HMO | Attending: Hematology

## 2024-01-25 VITALS — BP 131/74 | HR 60 | Temp 97.7°F | Resp 18

## 2024-01-25 DIAGNOSIS — E538 Deficiency of other specified B group vitamins: Secondary | ICD-10-CM | POA: Diagnosis present

## 2024-01-25 DIAGNOSIS — D509 Iron deficiency anemia, unspecified: Secondary | ICD-10-CM

## 2024-01-25 MED ORDER — CYANOCOBALAMIN 1000 MCG/ML IJ SOLN
1000.0000 ug | Freq: Once | INTRAMUSCULAR | Status: AC
  Administered 2024-01-25: 1000 ug via INTRAMUSCULAR
  Filled 2024-01-25: qty 1

## 2024-01-25 NOTE — Patient Instructions (Signed)
 [facility] [place] - A DEPT OF [facility]  Discharge Instructions: Thank you for choosing [facility] to provide your oncology and hematology care.  If you have a lab appointment with the Cancer Center - please note that after April 8th, 2024, all labs will be drawn in the cancer center.  You do not have to check in or register with the main entrance as you have in the past but will complete your check-in in the cancer center.  Wear comfortable clothing and clothing appropriate for easy access to any Portacath or PICC line.   We strive to give you quality time with your provider. You may need to reschedule your appointment if you arrive late (15 or more minutes).  Arriving late affects you and other patients whose appointments are after yours.  Also, if you miss three or more appointments without notifying the office, you may be dismissed from the clinic at the provider's discretion.      For prescription refill requests, have your pharmacy contact our office and allow 72 hours for refills to be completed.    Today you received the following chemotherapy and/or immunotherapy agents Vitamin B12 injection.  Vitamin B12 Injection What is this medication? Vitamin B12 (VAHY tuh min B12) prevents and treats low vitamin B12 levels in your body. It is used in people who do not get enough vitamin B12 from their diet or when their digestive tract does not absorb enough. Vitamin B12 plays an important role in maintaining the health of your nervous system and red blood cells. This medicine may be used for other purposes; ask your health care provider or pharmacist if you have questions. COMMON BRAND NAME(S): B-12 Compliance Kit, B-12 Injection Kit, Cyomin, Dodex, LA-12, Nutri-Twelve, Physicians EZ Use B-12, Primabalt, Vitamin Deficiency Injectable System - B12 What should I tell my care team before I take this medication? They need to know if you have any of these  conditions: Kidney disease Leber's disease Megaloblastic anemia An unusual or allergic reaction to cyanocobalamin, cobalt, other medications, foods, dyes, or preservatives Pregnant or trying to get pregnant Breast-feeding How should I use this medication? This medication is injected into a muscle or deeply under the skin. It is usually given in a clinic or care team's office. However, your care team may teach you how to inject yourself. Follow all instructions. Talk to your care team about the use of this medication in children. Special care may be needed. Overdosage: If you think you have taken too much of this medicine contact a poison control center or emergency room at once. NOTE: This medicine is only for you. Do not share this medicine with others. What if I miss a dose? If you are given your dose at a clinic or care team's office, call to reschedule your appointment. If you give your own injections, and you miss a dose, take it as soon as you can. If it is almost time for your next dose, take only that dose. Do not take double or extra doses. What may interact with this medication? Alcohol Colchicine This list may not describe all possible interactions. Give your health care provider a list of all the medicines, herbs, non-prescription drugs, or dietary supplements you use. Also tell them if you smoke, drink alcohol, or use illegal drugs. Some items may interact with your medicine. What should I watch for while using this medication? Visit your care team regularly. You may need blood work  done while you are taking this medication. You may need to follow a special diet. Talk to your care team. Limit your alcohol intake and avoid smoking to get the best benefit. What side effects may I notice from receiving this medication? Side effects that you should report to your care team as soon as possible: Allergic reactions--skin rash, itching, hives, swelling of the face, lips, tongue, or  throat Swelling of the ankles, hands, or feet Trouble breathing Side effects that usually do not require medical attention (report to your care team if they continue or are bothersome): Diarrhea This list may not describe all possible side effects. Call your doctor for medical advice about side effects. You may report side effects to FDA at 1-800-FDA-1088. Where should I keep my medication? Keep out of the reach of children. Store at room temperature between 15 and 30 degrees C (59 and 85 degrees F). Protect from light. Throw away any unused medication after the expiration date. NOTE: This sheet is a summary. It may not cover all possible information. If you have questions about this medicine, talk to your doctor, pharmacist, or health care provider.  2024 Elsevier/Gold Standard (2021-05-18 00:00:00)      To help prevent nausea and vomiting after your treatment, we encourage you to take your nausea medication as directed.  BELOW ARE SYMPTOMS THAT SHOULD BE REPORTED IMMEDIATELY: *FEVER GREATER THAN 100.4 F (38 C) OR HIGHER *CHILLS OR SWEATING *NAUSEA AND VOMITING THAT IS NOT CONTROLLED WITH YOUR NAUSEA MEDICATION *UNUSUAL SHORTNESS OF BREATH *UNUSUAL BRUISING OR BLEEDING *URINARY PROBLEMS (pain or burning when urinating, or frequent urination) *BOWEL PROBLEMS (unusual diarrhea, constipation, pain near the anus) TENDERNESS IN MOUTH AND THROAT WITH OR WITHOUT PRESENCE OF ULCERS (sore throat, sores in mouth, or a toothache) UNUSUAL RASH, SWELLING OR PAIN  UNUSUAL VAGINAL DISCHARGE OR ITCHING   Items with * indicate a potential emergency and should be followed up as soon as possible or go to the Emergency Department if any problems should occur.  Please show the CHEMOTHERAPY ALERT CARD or IMMUNOTHERAPY ALERT CARD at check-in to the Emergency Department and triage nurse.  Should you have questions after your visit or need to cancel or reschedule your appointment, please contact [facility]  [facility] [place] - A DEPT OF [facility] [phone]  and follow the prompts.  Office hours are 8:00 a.m. to 4:30 p.m. Monday - Friday. Please note that voicemails left after 4:00 p.m. may not be returned until the following business day.  We are closed weekends and major holidays. You have access to a nurse at all times for urgent questions. Please call the main number to the clinic [phone] and follow the prompts.  For any non-urgent questions, you may also contact your provider using MyChart. We now offer e-Visits for anyone 4 and older to request care online for non-urgent symptoms. For details visit [email or web address].   Also download the MyChart app! Go to the app store, search "MyChart", open the app, select [place], and log in with your MyChart username and password.

## 2024-01-25 NOTE — Progress Notes (Signed)
 Victoria Adkins presents today for injection per the provider's orders.  B12 administration without incident; injection site WNL; see MAR for injection details.  Patient tolerated procedure well and without incident.  No questions or complaints noted at this time. Discharged from clinic ambulatory in stable condition. Alert and oriented x 3. F/U with [facility] as scheduled.

## 2024-02-16 ENCOUNTER — Telehealth: Payer: Self-pay

## 2024-02-16 NOTE — Telephone Encounter (Signed)
 Called patient, she will think about it and call us  back.

## 2024-02-16 NOTE — Telephone Encounter (Signed)
 Copied from CRM [id number]. Topic: Appointments - Transfer of Care >> Feb 16, 2024  1:20 PM Talmadge Fail S wrote: Pt is requesting to transfer FROM: Dhruv Vyas Pt is requesting to transfer TO: Cleola Dach Reason for requested transfer: Patient was referred by Ivana Maris and BJ Case It is the responsibility of the team the patient would like to transfer to (Dr. Cleola Dach) to reach out to the patient if for any reason this transfer is not acceptable.

## 2024-02-20 ENCOUNTER — Other Ambulatory Visit: Payer: Self-pay | Admitting: Physician Assistant

## 2024-02-20 DIAGNOSIS — H401121 Primary open-angle glaucoma, left eye, mild stage: Secondary | ICD-10-CM | POA: Diagnosis not present

## 2024-02-22 ENCOUNTER — Inpatient Hospital Stay (HOSPITAL_BASED_OUTPATIENT_CLINIC_OR_DEPARTMENT_OTHER): Admitting: Oncology

## 2024-02-22 ENCOUNTER — Inpatient Hospital Stay: Attending: Hematology

## 2024-02-22 ENCOUNTER — Encounter: Payer: Self-pay | Admitting: Hematology

## 2024-02-22 ENCOUNTER — Inpatient Hospital Stay

## 2024-02-22 VITALS — BP 127/76 | HR 62 | Temp 97.9°F | Resp 16 | Wt 200.0 lb

## 2024-02-22 DIAGNOSIS — F32A Depression, unspecified: Secondary | ICD-10-CM | POA: Insufficient documentation

## 2024-02-22 DIAGNOSIS — D696 Thrombocytopenia, unspecified: Secondary | ICD-10-CM | POA: Insufficient documentation

## 2024-02-22 DIAGNOSIS — D509 Iron deficiency anemia, unspecified: Secondary | ICD-10-CM | POA: Diagnosis not present

## 2024-02-22 DIAGNOSIS — D472 Monoclonal gammopathy: Secondary | ICD-10-CM | POA: Insufficient documentation

## 2024-02-22 DIAGNOSIS — Z87891 Personal history of nicotine dependence: Secondary | ICD-10-CM | POA: Diagnosis not present

## 2024-02-22 DIAGNOSIS — J449 Chronic obstructive pulmonary disease, unspecified: Secondary | ICD-10-CM | POA: Insufficient documentation

## 2024-02-22 DIAGNOSIS — F419 Anxiety disorder, unspecified: Secondary | ICD-10-CM | POA: Insufficient documentation

## 2024-02-22 DIAGNOSIS — Z808 Family history of malignant neoplasm of other organs or systems: Secondary | ICD-10-CM | POA: Insufficient documentation

## 2024-02-22 DIAGNOSIS — E538 Deficiency of other specified B group vitamins: Secondary | ICD-10-CM | POA: Diagnosis present

## 2024-02-22 LAB — CBC WITH DIFFERENTIAL/PLATELET
Abs Immature Granulocytes: 0.03 10*3/uL (ref 0.00–0.07)
Basophils Absolute: 0.1 10*3/uL (ref 0.0–0.1)
Basophils Relative: 1 %
Eosinophils Absolute: 0.3 10*3/uL (ref 0.0–0.5)
Eosinophils Relative: 3 %
HCT: 41.4 % (ref 36.0–46.0)
Hemoglobin: 13.7 g/dL (ref 12.0–15.0)
Immature Granulocytes: 0 %
Lymphocytes Relative: 22 %
Lymphs Abs: 1.7 10*3/uL (ref 0.7–4.0)
MCH: 30.1 pg (ref 26.0–34.0)
MCHC: 33.1 g/dL (ref 30.0–36.0)
MCV: 91 fL (ref 80.0–100.0)
Monocytes Absolute: 0.5 10*3/uL (ref 0.1–1.0)
Monocytes Relative: 7 %
Neutro Abs: 5.1 10*3/uL (ref 1.7–7.7)
Neutrophils Relative %: 67 %
Platelets: 144 10*3/uL — ABNORMAL LOW (ref 150–400)
RBC: 4.55 MIL/uL (ref 3.87–5.11)
RDW: 13.6 % (ref 11.5–15.5)
WBC: 7.7 10*3/uL (ref 4.0–10.5)
nRBC: 0 % (ref 0.0–0.2)

## 2024-02-22 LAB — FERRITIN: Ferritin: 41 ng/mL (ref 11–307)

## 2024-02-22 LAB — IRON AND TIBC
Iron: 82 ug/dL (ref 28–170)
Saturation Ratios: 26 % (ref 10.4–31.8)
TIBC: 319 ug/dL (ref 250–450)
UIBC: 237 ug/dL

## 2024-02-22 MED ORDER — CYANOCOBALAMIN 1000 MCG/ML IJ SOLN
1000.0000 ug | Freq: Once | INTRAMUSCULAR | Status: AC
  Administered 2024-02-22: 1000 ug via INTRAMUSCULAR
  Filled 2024-02-22: qty 1

## 2024-02-22 NOTE — Progress Notes (Signed)
 Patient tolerated B12 injection with no complaints voiced.  Site clean and dry with no bruising or swelling noted at site.  See MAR for details.  Band aid applied.  Patient stable during and after injection.  Vss with discharge and left in satisfactory condition with no s/s of distress noted. All follow ups as scheduled.   Victoria Adkins [employer]

## 2024-02-22 NOTE — Patient Instructions (Signed)
 Vitamin B12 Injection What is this medication? Vitamin B12 (VAHY tuh min B12) prevents and treats low vitamin B12 levels in your body. It is used in people who do not get enough vitamin B12 from their diet or when their digestive tract does not absorb enough. Vitamin B12 plays an important role in maintaining the health of your nervous system and red blood cells. This medicine may be used for other purposes; ask your health care provider or pharmacist if you have questions. COMMON BRAND NAME(S): B-12 Compliance Kit, B-12 Injection Kit, Cyomin, Dodex, LA-12, Nutri-Twelve, Physicians EZ Use B-12, Primabalt, Vitamin Deficiency Injectable System - B12 What should I tell my care team before I take this medication? They need to know if you have any of these conditions: Kidney disease Leber's disease Megaloblastic anemia An unusual or allergic reaction to cyanocobalamin, cobalt, other medications, foods, dyes, or preservatives Pregnant or trying to get pregnant Breast-feeding How should I use this medication? This medication is injected into a muscle or deeply under the skin. It is usually given in a clinic or care team's office. However, your care team may teach you how to inject yourself. Follow all instructions. Talk to your care team about the use of this medication in children. Special care may be needed. Overdosage: If you think you have taken too much of this medicine contact a poison control center or emergency room at once. NOTE: This medicine is only for you. Do not share this medicine with others. What if I miss a dose? If you are given your dose at a clinic or care team's office, call to reschedule your appointment. If you give your own injections, and you miss a dose, take it as soon as you can. If it is almost time for your next dose, take only that dose. Do not take double or extra doses. What may interact with this medication? Alcohol Colchicine This list may not describe all possible  interactions. Give your health care provider a list of all the medicines, herbs, non-prescription drugs, or dietary supplements you use. Also tell them if you smoke, drink alcohol, or use illegal drugs. Some items may interact with your medicine. What should I watch for while using this medication? Visit your care team regularly. You may need blood work done while you are taking this medication. You may need to follow a special diet. Talk to your care team. Limit your alcohol intake and avoid smoking to get the best benefit. What side effects may I notice from receiving this medication? Side effects that you should report to your care team as soon as possible: Allergic reactions--skin rash, itching, hives, swelling of the face, lips, tongue, or throat Swelling of the ankles, hands, or feet Trouble breathing Side effects that usually do not require medical attention (report to your care team if they continue or are bothersome): Diarrhea This list may not describe all possible side effects. Call your doctor for medical advice about side effects. You may report side effects to FDA at 1-800-FDA-1088. Where should I keep my medication? Keep out of the reach of children. Store at room temperature between 15 and 30 degrees C (59 and 85 degrees F). Protect from light. Throw away any unused medication after the expiration date. NOTE: This sheet is a summary. It may not cover all possible information. If you have questions about this medicine, talk to your doctor, pharmacist, or health care provider.  2024 Elsevier/Gold Standard (2021-05-18 00:00:00)

## 2024-02-22 NOTE — Progress Notes (Signed)
 [facility] [address], [place] [address]   Clinic Day:  02/22/2024  Referring physician: Orlena Bitters, MD  Patient Care Team: Orlena Bitters, MD as PCP - General (Internal Medicine) Gerard Knight, MD as PCP - Cardiology (Cardiology)   ASSESSMENT & PLAN:   Assessment:  1.  Mild thrombocytopenia: - Patient seen at the request of Dr. Darien Eden - CBC on 08/01/2023, PLT-79K with platelet clumps noted - CBC (08/10/2023): PLT-107 - Reports easy bruising on the forearms and hands for several years.  No active bleeding. - Ultrasound abdomen (08/10/2023): Spleen normal in size and appearance.  Hepatic steatosis.  Cholelithiasis.  2.  Social/family history: - Lives at home by herself and is independent of ADLs and IADLs.  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  No chemical/pesticide exposure more than usual.  Quit smoking 12 years ago.  Smoked 1 pack/day for 20 years. - Maternal aunt had breast cancer.  Mother died of brain tumor.  Maternal uncle died of brain tumor.   Plan: 1. Thrombocytopenia (HCC) (Primary) -Platelet counts have ranged from 120 to normal since 2011. -Workup did not reveal any evidence of chronic inflammation, active tissue disorder or infectious etiology with negative or nonreactive hep B, hep C and HIV. -Most recent labs from 02/22/24 show a platelet count of 144 (166).  Will continue to monitor.  2. Iron  deficiency anemia, unspecified iron  deficiency anemia type -Victoria Adkins received 1 dose of INFeD  on 10/04/2023 along with weekly B12 x 4.  Last dose given on 01/25/24.Victoria Adkins is due for B12 injection today.  -Tolerated iron  infusion well. -Repeat labs from 02/22/2024 show hemoglobin of 13.7 (13.3), Ferritin 40 (100), iron  saturation 26% (30%) and normal TIBC. - We discussed that although her hemoglobin has not dropped, her iron  levels are trending down.  Would recommend 1 dose of INFeD  in the next coming weeks.  Victoria Adkins was agreeable.  3. MGUS (monoclonal  gammopathy of unknown significance) -Found incidentally during workup for anemia and thrombocytopenia. -SPEP with M spike of 0.2.  Immunofixation shows IgG monoclonal protein with lambda light chain specificity.  Kappa lambda light chain ratio normal. -Bone scan from 10/04/2023 did not reveal evidence of elevated heart or lesions. -UPEP did not reveal evidence of monoclonal protein.  Monoclonal protein. -No crab criteria-hemoglobin 13.7, creatinine normal and calcium 8.9.  No new bone pain. -Will continue to monitor this every 6 months and bone scan annually. Next due in Sept 2025   PLAN SUMMARY: >> Continue monthly B12 injections.  >> 1 dose INFeD  couple of weeks. >> RTC in 3 months with lab and telephone call.       Orders Placed This Encounter  Procedures   CBC with Differential    Standing Status:   Future    Expected Date:   06/23/2024    Expiration Date:   02/21/2025   Ferritin    Standing Status:   Future    Expected Date:   06/23/2024    Expiration Date:   02/21/2025   Iron  and TIBC ([facility] ONLY)    Standing Status:   Future    Expected Date:   06/23/2024    Expiration Date:   02/21/2025   Lactate dehydrogenase    Standing Status:   Future    Expected Date:   06/23/2024    Expiration Date:   02/21/2025   Kappa/lambda light chains    Standing Status:   Future    Expected Date:  06/23/2024    Expiration Date:   02/21/2025   Protein electrophoresis, serum    Standing Status:   Future    Expected Date:   06/23/2024    Expiration Date:   02/21/2025   Comprehensive metabolic panel with GFR    Standing Status:   Future    Expected Date:   06/23/2024    Expiration Date:   02/21/2025    Aurther Blue, NP   6/5/20252:44 PM  CHIEF COMPLAINT/PURPOSE OF CONSULT:   Diagnosis: Mild thrombocytopenia  Current Therapy: IDA, MGUS  HISTORY OF PRESENT ILLNESS:   Victoria Adkins is a 73 y.o. female presenting to clinic today for follow-up for MGUS, iron  deficiency anemia and  thrombocytopenia.  Victoria Adkins was last seen by me on 11/23/23.   In the interim, Victoria Adkins denies any hospitalizations, surgeries or changes to her baseline health.  Victoria Adkins has been receiving monthly B12 injections which Victoria Adkins feels like have been helping her energy level some.  Reports appetite of 100% energy levels of 75%.  Reports Victoria Adkins bruises easily but denies any bleeding.  Victoria Adkins continues iron  supplements daily.  Denies any constipation or GI upset.  Intermittent shortness of breath secondary to COPD.  Has underlying anxiety and depression that is stable.   PAST MEDICAL HISTORY:   Past Medical History: Past Medical History:  Diagnosis Date   Anxiety    Arthritis    COPD (chronic obstructive pulmonary disease) (HCC)    Depression    Dyspnea    History of nuclear stress test    Myoview  10/02/2023: Inferoseptal defect consistent with soft tissue attenuation, basal lateral defect partially reversible (soft tissue attenuation versus minor ischemic territory); EF 59; low risk   Hyperlipidemia    Iron  deficiency anemia 09/27/2023    Surgical History: Past Surgical History:  Procedure Laterality Date   CATARACT EXTRACTION W/PHACO Left 04/20/2018   Procedure: CATARACT EXTRACTION PHACO AND INTRAOCULAR LENS PLACEMENT (IOC);  Surgeon: Tarri Farm, MD;  Location: [facility];  Service: Ophthalmology;  Laterality: Left;  CDE: 4.71   CATARACT EXTRACTION W/PHACO Right 05/04/2018   Procedure: CATARACT EXTRACTION PHACO AND INTRAOCULAR LENS PLACEMENT (IOC);  Surgeon: Tarri Farm, MD;  Location: [facility];  Service: Ophthalmology;  Laterality: Right;  CDE: 5.56   CORRECTION HAMMER TOE Bilateral    REPLACEMENT TOTAL KNEE Right 2011   VAGINAL HYSTERECTOMY      Social History: Social History   Socioeconomic History   Marital status: Divorced    Spouse name: Not on file   Number of children: 1   Years of education: Not on file   Highest education level: High school graduate  Occupational History   Not on file   Tobacco Use   Smoking status: Former    Current packs/day: 0.00    Average packs/day: 1 pack/day for 20.0 years (20.0 ttl pk-yrs)    Types: Cigarettes    Start date: 09/20/1987    Quit date: 09/20/2007    Years since quitting: 16.4   Smokeless tobacco: Never  Vaping Use   Vaping status: Never Used  Substance and Sexual Activity   Alcohol use: Yes    Comment: Occasional   Drug use: No   Sexual activity: Not on file  Other Topics Concern   Not on file  Social History Narrative   Not on file   Social Drivers of Health   Financial Resource Strain: Low Risk  (04/12/2018)   Overall Financial Resource Strain (CARDIA)    Difficulty of Paying Living Expenses: Not  hard at all  Food Insecurity: No Food Insecurity (09/04/2023)   Hunger Vital Sign    Worried About Running Out of Food in the Last Year: Never true    Ran Out of Food in the Last Year: Never true  Transportation Needs: No Transportation Needs (09/04/2023)   PRAPARE - Administrator, Civil Service (Medical): No    Lack of Transportation (Non-Medical): No  Physical Activity: Insufficiently Active (03/06/2018)   [facility], [facility]   Exercise Vital Sign    Days of Exercise per Week: 2 days    Minutes of Exercise per Session: 30 min  Stress: Stress Concern Present (03/06/2018)   Received from [facility], [facility] - Occupational Stress Questionnaire    Feeling of Stress : Rather much  Social Connections: Not on file  Intimate Partner Violence: Not At Risk (09/04/2023)   Humiliation, Afraid, Rape, and Kick questionnaire    Fear of Current or Ex-Partner: No    Emotionally Abused: No    Physically Abused: No    Sexually Abused: No    Family History: Family History  Problem Relation Age of Onset   Brain cancer Mother    Dementia Father    Emphysema Father    Seizures Sister    Blindness Sister    Alcohol abuse Maternal  Grandfather    Breast cancer Neg Hx     Current Medications:  Current Outpatient Medications:    Blood Pressure Monitor KIT, 1 each by Does not apply route as directed. Dx: diastolic dysfunction, Disp: 1 kit, Rfl: 0   BREZTRI AEROSPHERE 160-9-4.8 MCG/ACT AERO, Inhale into the lungs as needed., Disp: , Rfl:    diclofenac (VOLTAREN) 75 MG EC tablet, Take 75 mg by mouth 2 (two) times daily., Disp: , Rfl:    furosemide  (LASIX ) 20 MG tablet, TAKE ONE TABLET BY MOUTH EVERY DAY AS NEEDED FOR SWELLING OR 2-3 LBS WEIGHT GAIN IN 24 HOURS, Disp: 30 tablet, Rfl: 1   glycopyrrolate  (ROBINUL ) 1 MG tablet, Take 1 mg by mouth 2 (two) times daily., Disp: , Rfl:    latanoprost (XALATAN) 0.005 % ophthalmic solution, Place 1 drop into both eyes at bedtime., Disp: , Rfl:    meclizine (ANTIVERT) 25 MG tablet, Take 25 mg by mouth 3 (three) times daily as needed., Disp: , Rfl:    memantine (NAMENDA) 5 MG tablet, Take 5 mg by mouth daily., Disp: , Rfl:    oxyCODONE (OXY IR/ROXICODONE) 5 MG immediate release tablet, Take 1 tablet every 4-6 hours by oral route as needed for pain for 7 days., Disp: , Rfl:    pregabalin (LYRICA) 100 MG capsule, Take 100 mg by mouth daily., Disp: , Rfl:    propranolol (INDERAL) 10 MG tablet, Take 10 mg by mouth at bedtime., Disp: , Rfl:    sertraline  (ZOLOFT ) 100 MG tablet, Take 100 mg by mouth 2 (two) times daily., Disp: , Rfl:    simvastatin (ZOCOR) 40 MG tablet, Take 40 mg by mouth at bedtime., Disp: , Rfl:    temazepam (RESTORIL) 30 MG capsule, Take 1 capsule by mouth at bedtime as needed., Disp: , Rfl:    Allergies: No Known Allergies  REVIEW OF SYSTEMS:   Review of Systems  Respiratory:  Positive for shortness of breath.   Neurological:  Positive for dizziness and numbness.  Psychiatric/Behavioral:  Positive for depression. The patient is nervous/anxious.   All  other systems reviewed and are negative.    VITALS:   Blood pressure 127/76, pulse 62, temperature 97.9 F  (36.6 C), temperature source Oral, resp. rate 16, weight 199 lb 15.3 oz (90.7 kg), SpO2 93%.  Wt Readings from Last 3 Encounters:  02/22/24 199 lb 15.3 oz (90.7 kg)  11/23/23 201 lb (91.2 kg)  09/27/23 204 lb 12.9 oz (92.9 kg)    Body mass index is 32.27 kg/m.   PHYSICAL EXAM:   Physical Exam Constitutional:      Appearance: Normal appearance.  Cardiovascular:     Rate and Rhythm: Normal rate and regular rhythm.  Pulmonary:     Effort: Pulmonary effort is normal.     Breath sounds: Normal breath sounds.  Abdominal:     General: Bowel sounds are normal.     Palpations: Abdomen is soft.  Musculoskeletal:        General: No swelling. Normal range of motion.  Neurological:     Mental Status: Victoria Adkins is alert and oriented to person, place, and time. Mental status is at baseline.     LABS:      Latest Ref Rng & Units 02/22/2024    9:52 AM 11/23/2023    9:08 AM 09/04/2023    2:05 PM  CBC  WBC 4.0 - 10.5 K/uL 7.7  8.8  10.1   Hemoglobin 12.0 - 15.0 g/dL 56.2  13.0  86.5   Hematocrit 36.0 - 46.0 % 41.4  41.4  37.7   Platelets 150 - 400 K/uL 144  166  120       Latest Ref Rng & Units 03/16/2021    3:07 PM 06/26/2018    2:39 PM 04/13/2018   11:24 AM  CMP  Glucose 70 - 99 mg/dL 99  89  91   BUN 8 - 23 mg/dL 21  20  15    Creatinine 0.44 - 1.00 mg/dL 7.84  6.96  2.95   Sodium 135 - 145 mmol/L 137  136  139   Potassium 3.5 - 5.1 mmol/L 4.1  4.0  4.4   Chloride 98 - 111 mmol/L 104  99  104   CO2 22 - 32 mmol/L 26  30  27    Calcium 8.9 - 10.3 mg/dL 8.9  9.7  9.2      No results found for: "CEA1", "CEA" / No results found for: "CEA1", "CEA" No results found for: "PSA1" No results found for: "CAN199" No results found for: "CAN125"  Lab Results  Component Value Date   TOTALPROTELP 6.7 11/23/2023   ALBUMINELP 4.0 11/23/2023   A1GS 0.2 11/23/2023   A2GS 0.8 11/23/2023   BETS 1.0 11/23/2023   GAMS 0.6 11/23/2023   MSPIKE 0.2 (H) 11/23/2023   SPEI Comment 11/23/2023   Lab  Results  Component Value Date   TIBC 319 02/22/2024   TIBC 294 11/23/2023   TIBC 431 09/04/2023   FERRITIN 41 02/22/2024   FERRITIN 100 11/23/2023   FERRITIN 7 (L) 09/04/2023   IRONPCTSAT 26 02/22/2024   IRONPCTSAT 30 11/23/2023   IRONPCTSAT 9 (L) 09/04/2023   Lab Results  Component Value Date   LDH 153 11/23/2023   LDH 168 09/04/2023     STUDIES:   No results found.

## 2024-02-26 ENCOUNTER — Encounter: Payer: Self-pay | Admitting: Hematology

## 2024-02-27 ENCOUNTER — Inpatient Hospital Stay

## 2024-02-27 VITALS — BP 120/69 | HR 60 | Temp 97.9°F | Resp 18

## 2024-02-27 DIAGNOSIS — D509 Iron deficiency anemia, unspecified: Secondary | ICD-10-CM

## 2024-02-27 DIAGNOSIS — E538 Deficiency of other specified B group vitamins: Secondary | ICD-10-CM | POA: Diagnosis not present

## 2024-02-27 MED ORDER — CETIRIZINE HCL 10 MG/ML IV SOLN
10.0000 mg | Freq: Once | INTRAVENOUS | Status: AC
  Administered 2024-02-27: 10 mg via INTRAVENOUS
  Filled 2024-02-27: qty 1

## 2024-02-27 MED ORDER — ACETAMINOPHEN 325 MG PO TABS
650.0000 mg | ORAL_TABLET | Freq: Once | ORAL | Status: AC
  Administered 2024-02-27: 650 mg via ORAL
  Filled 2024-02-27: qty 2

## 2024-02-27 MED ORDER — SODIUM CHLORIDE 0.9 % IV SOLN: INTRAVENOUS | Status: DC

## 2024-02-27 MED ORDER — METHYLPREDNISOLONE SODIUM SUCC 125 MG IJ SOLR
125.0000 mg | Freq: Once | INTRAMUSCULAR | Status: AC
  Administered 2024-02-27: 125 mg via INTRAVENOUS
  Filled 2024-02-27: qty 2

## 2024-02-27 MED ORDER — FAMOTIDINE IN NACL 20-0.9 MG/50ML-% IV SOLN
20.0000 mg | Freq: Once | INTRAVENOUS | Status: AC
  Administered 2024-02-27: 20 mg via INTRAVENOUS
  Filled 2024-02-27: qty 50

## 2024-02-27 MED ORDER — SODIUM CHLORIDE 0.9 % IV SOLN
1000.0000 mg | Freq: Once | INTRAVENOUS | Status: AC
  Administered 2024-02-27: 1000 mg via INTRAVENOUS
  Filled 2024-02-27: qty 20

## 2024-02-27 NOTE — Progress Notes (Signed)
 Patient tolerated iron infusion with no complaints voiced.  Peripheral IV site clean and dry with good blood return noted before and after infusion.  Band aid applied.  VSS with discharge and left in satisfactory condition with no s/s of distress noted.

## 2024-02-27 NOTE — Patient Instructions (Signed)
 [facility] [place] - A DEPT OF [facility]  Discharge Instructions: Thank you for choosing [facility] to provide your oncology and hematology care.  If you have a lab appointment with the Cancer Center - please note that after April 8th, 2024, all labs will be drawn in the cancer center.  You do not have to check in or register with the main entrance as you have in the past but will complete your check-in in the cancer center.  Wear comfortable clothing and clothing appropriate for easy access to any Portacath or PICC line.   We strive to give you quality time with your provider. You may need to reschedule your appointment if you arrive late (15 or more minutes).  Arriving late affects you and other patients whose appointments are after yours.  Also, if you miss three or more appointments without notifying the office, you may be dismissed from the clinic at the provider's discretion.      For prescription refill requests, have your pharmacy contact our office and allow 72 hours for refills to be completed.    Today you received the following Infed, return as scheduled.   To help prevent nausea and vomiting after your treatment, we encourage you to take your nausea medication as directed.  BELOW ARE SYMPTOMS THAT SHOULD BE REPORTED IMMEDIATELY: *FEVER GREATER THAN 100.4 F (38 C) OR HIGHER *CHILLS OR SWEATING *NAUSEA AND VOMITING THAT IS NOT CONTROLLED WITH YOUR NAUSEA MEDICATION *UNUSUAL SHORTNESS OF BREATH *UNUSUAL BRUISING OR BLEEDING *URINARY PROBLEMS (pain or burning when urinating, or frequent urination) *BOWEL PROBLEMS (unusual diarrhea, constipation, pain near the anus) TENDERNESS IN MOUTH AND THROAT WITH OR WITHOUT PRESENCE OF ULCERS (sore throat, sores in mouth, or a toothache) UNUSUAL RASH, SWELLING OR PAIN  UNUSUAL VAGINAL DISCHARGE OR ITCHING   Items with * indicate a potential emergency and should be followed up as soon as possible or go  to the Emergency Department if any problems should occur.  Please show the CHEMOTHERAPY ALERT CARD or IMMUNOTHERAPY ALERT CARD at check-in to the Emergency Department and triage nurse.  Should you have questions after your visit or need to cancel or reschedule your appointment, please contact [facility] [place] - A DEPT OF [facility] [phone]  and follow the prompts.  Office hours are 8:00 a.m. to 4:30 p.m. Monday - Friday. Please note that voicemails left after 4:00 p.m. may not be returned until the following business day.  We are closed weekends and major holidays. You have access to a nurse at all times for urgent questions. Please call the main number to the clinic [phone] and follow the prompts.  For any non-urgent questions, you may also contact your provider using MyChart. We now offer e-Visits for anyone 16 and older to request care online for non-urgent symptoms. For details visit [email or web address].   Also download the MyChart app! Go to the app store, search "MyChart", open the app, select , and log in with your MyChart username and password.

## 2024-03-04 ENCOUNTER — Ambulatory Visit: Payer: Medicare HMO | Attending: Cardiology | Admitting: Cardiology

## 2024-03-04 ENCOUNTER — Encounter: Payer: Self-pay | Admitting: Cardiology

## 2024-03-04 VITALS — BP 116/70 | HR 66 | Ht 66.0 in | Wt 198.4 lb

## 2024-03-04 DIAGNOSIS — Z87898 Personal history of other specified conditions: Secondary | ICD-10-CM

## 2024-03-04 DIAGNOSIS — E782 Mixed hyperlipidemia: Secondary | ICD-10-CM

## 2024-03-04 NOTE — Patient Instructions (Signed)
Medication Instructions:  Your physician recommends that you continue on your current medications as directed. Please refer to the Current Medication list given to you today.   Labwork: None  Testing/Procedures: None  Follow-Up: 6 months  Any Other Special Instructions Will Be Listed Below (If Applicable).     If you need a refill on your cardiac medications before your next appointment, please call your pharmacy.

## 2024-03-04 NOTE — Progress Notes (Signed)
    Cardiology Office Note  Date: 03/04/2024   ID: Victoria Adkins, DOB Sep 19, 1951, MRN [id number]  History of Present Illness: Victoria Adkins is a 73 y.o. female last seen in December 2024 by Ms. Tommi Fraise, I reviewed the note.  She is here for a follow-up visit.  Reports no syncope, does feel lightheaded sometimes when she stands up quickly.  No exertional chest pain or palpitations.  She has been having some trouble with short-term memory.  We went over her medications.  She does not report any recent changes.  She has a visit to see her PCP Dr. Darien Eden this week.  I went over her interval lab work which is outlined below.  Physical Exam: VS:  BP 116/70 (BP Location: Left Arm, Patient Position: Sitting, Cuff Size: Normal)   Pulse 66   Ht 5' 6 (1.676 m)   Wt 198 lb 6.4 oz (90 kg)   SpO2 96%   BMI 32.02 kg/m , BMI Body mass index is 32.02 kg/m.  Wt Readings from Last 3 Encounters:  03/04/24 198 lb 6.4 oz (90 kg)  02/22/24 199 lb 15.3 oz (90.7 kg)  11/23/23 201 lb (91.2 kg)    General: Patient appears comfortable at rest. HEENT: Conjunctiva and lids normal. Neck: Supple, no elevated JVP or carotid bruits. Lungs: Clear to auscultation, nonlabored breathing at rest. Cardiac: Regular rate and rhythm, no S3 or significant systolic murmur.  ECG:  An ECG dated 08/01/2023 was personally reviewed today and demonstrated:  Sinus rhythm with PVC and leftward axis.  Labwork: August 2024: Cholesterol 181, triglycerides 175, HDL 49, LDL 102 09/04/2023: TSH 2.304 02/22/2024: Hemoglobin 13.7; Platelets 144  February 2025: BUN 14, creatinine 0.98, potassium 4.7, AST 18, ALT 17  Other Studies Reviewed Today:  Lexiscan  Myoview  10/02/2023:   Findings are equivocal. The study is low risk.   No ST deviation was noted. The ECG was negative for ischemia.   LV perfusion is equivocal.  Small, mild intensity, mid to apical inferoseptal defect that is more prominent at rest and consistent with soft  tissue attenuation.  There is a smaller, also mild defect in the basal lateral wall that is partially reversible, potentially soft tissue attenuation as well although cannot exclude minor ischemic territory.   Left ventricular function is normal. Nuclear stress EF: 59%.   Equivocal but overall low risk study.  Soft tissue attenuation suspected, cannot completely exclude a minor basal lateral ischemic territory.  LVEF 59%.  Assessment and Plan:  1.  History of syncope.  Potentially neurocardiogenic.  She does not report any recurring syncope, does feel lightheaded at times when she stands up quickly.  At this point not on any specific treatment such as midodrine , can be reconsidered if symptoms worsen.   2.  Mixed hyperlipidemia.  Continue Zocor 40 mg daily with follow-up by Dr. Darien Eden.  Disposition:  Follow up 6 months.  Signed, Gerard Knight, M.D., F.A.C.C. [facility]

## 2024-03-05 DIAGNOSIS — J449 Chronic obstructive pulmonary disease, unspecified: Secondary | ICD-10-CM | POA: Diagnosis not present

## 2024-03-05 DIAGNOSIS — J3489 Other specified disorders of nose and nasal sinuses: Secondary | ICD-10-CM | POA: Diagnosis not present

## 2024-03-05 DIAGNOSIS — Z0000 Encounter for general adult medical examination without abnormal findings: Secondary | ICD-10-CM | POA: Diagnosis not present

## 2024-03-05 DIAGNOSIS — F1320 Sedative, hypnotic or anxiolytic dependence, uncomplicated: Secondary | ICD-10-CM | POA: Diagnosis not present

## 2024-03-05 DIAGNOSIS — Z1331 Encounter for screening for depression: Secondary | ICD-10-CM | POA: Diagnosis not present

## 2024-03-05 DIAGNOSIS — Z1339 Encounter for screening examination for other mental health and behavioral disorders: Secondary | ICD-10-CM | POA: Diagnosis not present

## 2024-03-05 DIAGNOSIS — F03A0 Unspecified dementia, mild, without behavioral disturbance, psychotic disturbance, mood disturbance, and anxiety: Secondary | ICD-10-CM | POA: Diagnosis not present

## 2024-03-05 DIAGNOSIS — Z7189 Other specified counseling: Secondary | ICD-10-CM | POA: Diagnosis not present

## 2024-03-05 DIAGNOSIS — Z6831 Body mass index (BMI) 31.0-31.9, adult: Secondary | ICD-10-CM | POA: Diagnosis not present

## 2024-03-05 DIAGNOSIS — Z299 Encounter for prophylactic measures, unspecified: Secondary | ICD-10-CM | POA: Diagnosis not present

## 2024-03-06 ENCOUNTER — Encounter: Payer: Self-pay | Admitting: Hematology

## 2024-03-15 ENCOUNTER — Inpatient Hospital Stay (HOSPITAL_BASED_OUTPATIENT_CLINIC_OR_DEPARTMENT_OTHER): Admitting: Oncology

## 2024-03-15 DIAGNOSIS — D696 Thrombocytopenia, unspecified: Secondary | ICD-10-CM

## 2024-03-15 DIAGNOSIS — D509 Iron deficiency anemia, unspecified: Secondary | ICD-10-CM | POA: Diagnosis not present

## 2024-03-15 DIAGNOSIS — D472 Monoclonal gammopathy: Secondary | ICD-10-CM

## 2024-03-15 NOTE — Progress Notes (Signed)
 Virtual Visit via Telephone Note  I connected with Victoria Adkins on 03/15/24 at  9:00 AM EDT by telephone and verified that I am speaking with the correct person using two identifiers.  Location: Patient: Home Provider: Clinic    I discussed the limitations, risks, security and privacy concerns of performing an evaluation and management service by telephone and the availability of in person appointments. I also discussed with the patient that there may be a patient responsible charge related to this service. The patient expressed understanding and agreed to proceed.   History of Present Illness: Patient is a 73 year old female here for follow-up for iron  deficiency anemia.  She was seen by me last on 02/22/2024.  Since her visit, she received 1 dose of INFeD  on 02/27/2024 and B12 shot.  Reports she is doing well since her infusion.  She wanted to schedule a repeat appointment to discuss her labs again and figure out why she continues to need iron  infusions.  She is taking iron  supplements daily.  She has not noticed any blood in her stools.  She eats a variety in her diet.  She denies any ice pica.  Energy levels are stable.  She feels pretty good.    Observations/Objective:  Review of Systems  Constitutional:  Negative for malaise/fatigue and weight loss.  Gastrointestinal:  Negative for abdominal pain and blood in stool.  Neurological:  Negative for dizziness and headaches.   Physical Exam  Neurological:     Mental Status: She is alert and oriented to person, place, and time.    Assessment and Plan: 1. Iron  deficiency anemia, unspecified iron  deficiency anemia type (Primary) -She received 1 dose of INFeD  on 02/27/2024 with great tolerance. -She has been receiving B12 shots monthly last given on 02/22/2024. -We discussed that her iron  deficiency is likely secondary to malabsorption and possible slow GI bleed.  She reports she has 3 hiatal hernias but spoke to [facility] gastroenterology who  did not recommend intervention at this time. -She has not had a colonoscopy since 2015. -Although, she has needed several IV iron  infusions, her hemoglobin is maintaining at this time.  I would recommend continuing to monitor and give IV iron  as needed based on lab work. -She is currently on iron  supplements daily.  Recommend she start taking with vitamin C or orange juice.  -She was in agreement and we will keep her appointment for October 2025. -Labs from 02/22/2024 show hemoglobin of 13.7 (13.3), Ferritin 40 (100), iron  saturation 26% (30%) and normal TIBC.   Follow Up Instructions: Keep scheduled follow-up in October for labs and office visit. Monthly B12 shots.  We will add labs on for 04/25/2024 to make sure she is maintaining well.    I discussed the assessment and treatment plan with the patient. The patient was provided an opportunity to ask questions and all were answered. The patient agreed with the plan and demonstrated an understanding of the instructions.   The patient was advised to call back or seek an in-person evaluation if the symptoms worsen or if the condition fails to improve as anticipated.  I provided 20 minutes of non-face-to-face time during this encounter.   Delon FORBES Hope, NP

## 2024-03-25 ENCOUNTER — Inpatient Hospital Stay: Attending: Hematology

## 2024-03-25 ENCOUNTER — Inpatient Hospital Stay

## 2024-03-25 VITALS — BP 108/85 | HR 76 | Temp 97.9°F | Resp 18

## 2024-03-25 DIAGNOSIS — E538 Deficiency of other specified B group vitamins: Secondary | ICD-10-CM | POA: Insufficient documentation

## 2024-03-25 DIAGNOSIS — D509 Iron deficiency anemia, unspecified: Secondary | ICD-10-CM

## 2024-03-25 MED ORDER — CYANOCOBALAMIN 1000 MCG/ML IJ SOLN
1000.0000 ug | Freq: Once | INTRAMUSCULAR | Status: AC
  Administered 2024-03-25: 1000 ug via INTRAMUSCULAR
  Filled 2024-03-25: qty 1

## 2024-03-25 NOTE — Patient Instructions (Signed)

## 2024-03-25 NOTE — Progress Notes (Signed)
 Patient tolerated injection with no complaints voiced.  Site clean and dry with no bruising or swelling noted at site.  See MAR for details.  Band aid applied.  Patient stable during and after injection.  Vss with discharge and left in satisfactory condition with no s/s of distress noted.

## 2024-03-27 ENCOUNTER — Encounter: Payer: Self-pay | Admitting: *Deleted

## 2024-03-28 ENCOUNTER — Ambulatory Visit: Admitting: Neurology

## 2024-03-28 ENCOUNTER — Encounter: Payer: Self-pay | Admitting: Neurology

## 2024-03-28 VITALS — BP 118/66 | HR 64 | Ht 66.0 in | Wt 199.6 lb

## 2024-03-28 DIAGNOSIS — D508 Other iron deficiency anemias: Secondary | ICD-10-CM

## 2024-03-28 DIAGNOSIS — R413 Other amnesia: Secondary | ICD-10-CM

## 2024-03-28 DIAGNOSIS — G479 Sleep disorder, unspecified: Secondary | ICD-10-CM | POA: Diagnosis not present

## 2024-03-28 DIAGNOSIS — Z789 Other specified health status: Secondary | ICD-10-CM

## 2024-03-28 DIAGNOSIS — Z79899 Other long term (current) drug therapy: Secondary | ICD-10-CM | POA: Diagnosis not present

## 2024-03-28 NOTE — Progress Notes (Signed)
 Subjective:    Patient ID: Victoria Adkins is a 73 y.o. female.  HPI    True Mar, MD, PhD [facility] [address], [address] [address] [place], [place] [address]  Dear Victoria Adkins,   I saw your patient, Victoria Adkins, upon your kind request in my neurologic clinic today for initial consultation of Victoria Adkins memory loss.  The patient is accompanied by Victoria Adkins friend today.  As you know, Victoria Adkins is a 73 year old female with an underlying complex medical history of iron  deficiency anemia, B12 deficiency, COPD, prior smoking, anxiety, depression, insomnia, arthritis, cataracts, status post right total knee replacement, history of foot deformities with status post multiple surgeries as a child, and obesity, who reports forgetfulness and difficulty retaining information for the past few years, symptoms have been ongoing for at least 2 years.  She reports a family history of dementia in Victoria Adkins father who lived to be 40 and a family history of brain cancer in Victoria Adkins mother who died at 90.  She also reports that Victoria Adkins brother has brain cancer.  She has never had a brain MRI.  She had an office visit with you on 03/05/2024 and I reviewed the office visit note.  She has been on memantine for the past several months, she increased it from 5 mg once daily to 10 mg twice daily at the time.  She has been on vitamin B12 injections monthly.  She had extensive blood work through hematology which I reviewed in Victoria Adkins electronic chart.  She had a low normal vitamin B12 on 09/04/2023, TSH was normal at 2.3 at the time.  She had a normal CMP on 10/30/2023.  She has been taking multiple potentially sedating and psychotropic and neurotropic medications, currently continues to take Lyrica 100 mg at bedtime per podiatry although she does not believe it has been very helpful.  She reports occasional tingling in Victoria Adkins feet.  She has no significant pain in Victoria Adkins feet.  She has been on Wellbutrin but does not actually have it on Victoria Adkins  list of medications, she also takes sertraline  200 mg daily and continues to take temazepam 30 mg at bedtime as well as hydroxyzine at bedtime.  She does not drink any water on a day-to-day basis, estimates that she drinks about a cup every other day.  She drinks coffee in the morning, usually 1 cup and 2 bottles of diet soda per day, 12 ounce size each.  She has fallen in the past.  She denies any sudden onset one-sided weakness or numbness or tingling or droopy face or slurring of speech.  She drinks alcohol in the form of beer, once every 2 months or so.  She quit smoking in 2009.  She had a sleep study in November 2023 through [facility]monology and reportedly she did not have any obstructive sleep apnea at the time but did have oxygen desaturation into the 80s.  AHI was reportedly less than 5/h.  I did not see the official sleep study report. I had evaluated Victoria Adkins a few years ago for tremors.  He had a head CT without contrast through [facility] on 01/19/2022 with clinical data of fall, headache, dizziness.  I reviewed the results:    IMPRESSION:  1. No evidence of acute intracranial abnormality.  2. Mild-to-moderate chronic small vessel ischemic disease.    Previously:   12/18/19: 73 year old right-handed woman with an underlying medical history of depression, hyperlipidemia, insomnia, prior smoking, arthritis, anxiety, and obesity, who  reports a Bilateral hand tremor for the past 12 to 18 months.  It has been somewhat progressive.  She reports that Victoria Adkins father had hand tremors.  He lived to be 74 years old.  She had a total of 3 sisters and 1 brother, 2 sisters passed away.  Patient is divorced and lives alone, she has 1 daughter who lives in [place]. I reviewed your office note from 10/30/2019.  She was started on propranolol low-dose, 10 mg daily.  Of note, she has been on sertraline  high-dose 200 mg daily.  She also takes trazodone  at night and Restoril 30 mg at night. She had blood work  through your office on 10/25/2019 and I reviewed the results: CBC with differential was unremarkable, CMP showed BUN of 19, creatinine 0.79, glucose of 90, and otherwise unremarkable findings, B12 level was 592, TSH 2.03. Of note, Victoria Adkins sertraline  was increased from 100 mg daily to 200 mg daily some 2 years ago.  She reports bilateral knee pain, mostly on the L at this point, had a right total knee arthroplasty in 2011 and has left knee problems, sees Dr. Ivonne for this.  She quit smoking 10 years ago.  She does not drink a whole lot of water.  May be less than 1 bottle per day, likes to drink soda, 12 ounce bottles, 2 to 3/day and 1 cup of coffee in the morning, she drinks alcohol rarely. She does not sleep very well.  She also reports additional symptoms of intermittent lightheadedness and dizziness.  She has had memory loss.     Victoria Adkins Past Medical History Is Significant For: Past Medical History:  Diagnosis Date   Anxiety    Arthritis    COPD (chronic obstructive pulmonary disease) (HCC)    Depression    Dyspnea    History of nuclear stress test    Myoview  10/02/2023: Inferoseptal defect consistent with soft tissue attenuation, basal lateral defect partially reversible (soft tissue attenuation versus minor ischemic territory); EF 59; low risk   Hyperlipidemia    Iron  deficiency anemia 09/27/2023    Victoria Adkins Past Surgical History Is Significant For: Past Surgical History:  Procedure Laterality Date   CATARACT EXTRACTION W/PHACO Left 04/20/2018   Procedure: CATARACT EXTRACTION PHACO AND INTRAOCULAR LENS PLACEMENT (IOC);  Surgeon: Harrie Agent, MD;  Location: [facility];  Service: Ophthalmology;  Laterality: Left;  CDE: 4.71   CATARACT EXTRACTION W/PHACO Right 05/04/2018   Procedure: CATARACT EXTRACTION PHACO AND INTRAOCULAR LENS PLACEMENT (IOC);  Surgeon: Harrie Agent, MD;  Location: [facility];  Service: Ophthalmology;  Laterality: Right;  CDE: 5.56   CORRECTION HAMMER TOE Bilateral    all toes    REPLACEMENT TOTAL KNEE Right 2011   VAGINAL HYSTERECTOMY     partial hysterectomy (still has ovaries)    Victoria Adkins Family History Is Significant For: Family History  Problem Relation Age of Onset   Brain cancer Mother    Dementia Father    Emphysema Father    Seizures Sister    Blindness Sister    Brain cancer Brother    Alcohol abuse Maternal Grandfather    Breast cancer Neg Hx     Victoria Adkins Social History Is Significant For: Social History   Socioeconomic History   Marital status: Divorced    Spouse name: Not on file   Number of children: 1   Years of education: Not on file   Highest education level: High school graduate  Occupational History   Not on file  Tobacco Use  Smoking status: Former    Current packs/day: 0.00    Average packs/day: 1 pack/day for 20.0 years (20.0 ttl pk-yrs)    Types: Cigarettes    Start date: 09/20/1987    Quit date: 09/20/2007    Years since quitting: 16.5   Smokeless tobacco: Never  Vaping Use   Vaping status: Never Used  Substance and Sexual Activity   Alcohol use: Yes    Comment: Occasional   Drug use: Never   Sexual activity: Not on file  Other Topics Concern   Not on file  Social History Narrative   Caffien coffee 1 cup daily, pepsi diet 2 daily   Working retired   Lives alone, with dog    Social Drivers of Corporate investment banker Strain: Low Risk  (04/12/2018)   Overall Financial Resource Strain (CARDIA)    Difficulty of Paying Living Expenses: Not hard at all  Food Insecurity: No Food Insecurity (09/04/2023)   Hunger Vital Sign    Worried About Running Out of Food in the Last Year: Never true    Ran Out of Food in the Last Year: Never true  Transportation Needs: No Transportation Needs (09/04/2023)   PRAPARE - Administrator, Civil Service (Medical): No    Lack of Transportation (Non-Medical): No  Physical Activity: Insufficiently Active (03/06/2018)   Received from [facility]   Exercise Vital Sign    Days of  Exercise per Week: 2 days    Minutes of Exercise per Session: 30 min  Stress: Stress Concern Present (03/06/2018)   Received from [facility] - Occupational Stress Questionnaire    Feeling of Stress : Rather much  Social Connections: Not on file    Victoria Adkins Allergies Are:  No Known Allergies:   Victoria Adkins Current Medications Are:  Outpatient Encounter Medications as of 03/28/2024  Medication Sig   Blood Pressure Monitor KIT 1 each by Does not apply route as directed. Dx: diastolic dysfunction   BREZTRI AEROSPHERE 160-9-4.8 MCG/ACT AERO Inhale into the lungs as needed.   Cholecalciferol (VITAMIN D3) 25 MCG CAPS Take by mouth.   diclofenac (VOLTAREN) 75 MG EC tablet Take 75 mg by mouth 2 (two) times daily.   Fluticasone-Umeclidin-Vilant (TRELEGY ELLIPTA) 100-62.5-25 MCG/ACT AEPB Inhale 1 puff into the lungs.   furosemide  (LASIX ) 20 MG tablet TAKE ONE TABLET BY MOUTH EVERY DAY AS NEEDED FOR SWELLING OR 2-3 LBS WEIGHT GAIN IN 24 HOURS (Patient taking differently: Take 20 mg by mouth daily.)   glycopyrrolate  (ROBINUL ) 1 MG tablet Take 1 mg by mouth 2 (two) times daily.   hydrOXYzine (ATARAX) 25 MG tablet Take 25 mg by mouth 3 (three) times daily as needed.   latanoprost (XALATAN) 0.005 % ophthalmic solution Place 1 drop into both eyes at bedtime.   memantine (NAMENDA) 10 MG tablet Take 10 mg by mouth 2 (two) times daily.   memantine (NAMENDA) 5 MG tablet Take 5 mg by mouth daily.   pregabalin (LYRICA) 100 MG capsule Take 100 mg by mouth daily.   propranolol (INDERAL) 10 MG tablet Take 10 mg by mouth at bedtime.   sertraline  (ZOLOFT ) 100 MG tablet Take 100 mg by mouth 2 (two) times daily.   simvastatin (ZOCOR) 40 MG tablet Take 40 mg by mouth at bedtime.   temazepam (RESTORIL) 30 MG capsule Take 1 capsule by mouth at bedtime as needed.   meclizine (ANTIVERT) 25 MG tablet Take 25 mg by mouth  3 (three) times daily as needed. (Patient not taking: Reported on  03/28/2024)   [DISCONTINUED] oxyCODONE (OXY IR/ROXICODONE) 5 MG immediate release tablet Take 1 tablet every 4-6 hours by oral route as needed for pain for 7 days.   No facility-administered encounter medications on file as of 03/28/2024.  :   Review of Systems:  Out of a complete 14 point review of systems, all are reviewed and negative with the exception of these symptoms as listed below:  Review of Systems  Neurological:        Pt here for memory loss.  [place] 21.     Objective:  Neurological Exam  Physical Exam Physical Examination:   Vitals:   03/28/24 1447  BP: 118/66  Pulse: 64  SpO2: 97%    General Examination: The patient is a very pleasant 73 y.o. female in no acute distress. She appears well-developed and well-nourished and well groomed.   HEENT: Normocephalic, atraumatic, pupils are equal, round and reactive to light, extraocular tracking is good without limitation to gaze excursion or nystagmus noted. Hearing is grossly intact. Face is symmetric with normal facial animation and normal facial sensation to light touch, temperature and vibration sense. Speech is clear with no dysarthria noted. There is no hypophonia. There is no lip, neck/head, jaw or voice tremor. Neck is supple with full range of passive and active motion. There are no carotid bruits on auscultation. Oropharynx exam reveals: moderate mouth dryness, adequate dental hygiene and mild airway crowding. Tongue protrudes centrally and palate elevates symmetrically.   Chest: Clear to auscultation without wheezing, rhonchi or crackles noted.  Heart: S1+S2+0, regular and normal without murmurs, rubs or gallops noted.   Abdomen: Soft, non-tender and non-distended.  Extremities: There is no pitting edema in the distal lower extremities bilaterally.   Skin: Warm and dry without trophic changes noted.   Musculoskeletal: exam reveals bilateral foot deformities in the toes.  Status post surgeries.     Neurologically:  Mental status: The patient is awake, alert and able to provide some of Victoria Adkins history, some information is supplemented by Victoria Adkins friend.  There is no evidence of aphasia, agnosia, apraxia or anomia. Speech is clear with normal prosody and enunciation. Thought process is linear. Mood is constricted and affect is blunted.     03/28/2024    3:00 PM  Montreal Cognitive Assessment   Visuospatial/ Executive (0/5) 4  Naming (0/3) 3  Attention: Read list of digits (0/2) 1  Attention: Read list of letters (0/1) 1  Attention: Serial 7 subtraction starting at 100 (0/3) 2  Language: Repeat phrase (0/2) 1  Language : Fluency (0/1) 1  Abstraction (0/2) 2  Delayed Recall (0/5) 1  Orientation (0/6) 5  Total 21    Cranial nerves II - XII are as described above under HEENT exam.  Motor exam: Normal bulk, strength and tone is noted. There is no obvious action or resting tremor.  Reflexes 1+ in the upper extremities and trace in the lower extremities, toes are downgoing bilaterally. Fine motor skills and coordination: grossly intact with finger taps, hand movements and rapid alternating patting with both upper extremities, normal foot taps bilaterally in the lower extremities.  Cerebellar testing: No dysmetria or intention tremor. There is no truncal or gait ataxia.  Normal finger-to-nose, normal heel-to-shin bilaterally. Sensory exam: intact to light touch and vibration sense in the upper and lower extremities but she did have mild decrease in temperature sense in the distal lower extremities bilaterally, up to the  mid shin areas.  Gait, station and balance: She stands with mild difficulty, walks somewhat slowly and cautiously, no walking aids, has preserved arm swing.  Assessment and plan:  In summary, Victoria Adkins is a very pleasant 73 year old female with an underlying complex medical history of iron  deficiency anemia, B12 deficiency, COPD, prior smoking, anxiety, depression, insomnia,  arthritis, cataracts, status post right total knee replacement, history of foot deformities with status post multiple surgeries as a child, and obesity, who presents for evaluation of Victoria Adkins memory loss of about 2 years duration.  She does have vascular risk factors and the family history for memory loss but lifestyle changes and medical history also contribute including medication side effects may be at play as well.  I had a long discussion with the patient and Victoria Adkins friend today.  I think she has a multifactorial issue with regards to memory function and we talked about the importance of maintaining a healthy lifestyle and critically reviewing Victoria Adkins medication regimen.  I think she is at risk for polypharmacy.    Below is a summary of my recommendations and our discussion points based on today's visit.  She was given these instructions verbally during the visit and also in Victoria Adkins after visit summary.   This was an extended visit of over 60 minutes with copious record review involved and considerable counseling and coordination of care.    << Repeat blood work (which you can have with Dr. Rosamond) to recheck your vitamin B12 level.  We will do a brain scan, called MRI and call you with the test results. We will have to schedule you for this on a separate date. This test requires authorization from your insurance, and we will take care of the insurance process. You can continue your medication for memory loss through Dr. Rosamond. You take several medications that can affect your memory function adversely: temazepam, pregabalin, and hydroxyzine. I recommend you talk to your prescribers about reducing or coming off these meds.    Please increase your water intake to 6-8 cups of water per day, spread throughout the day and reduce your caffeine intake to 1-2 servings per day. You have about 4 servings per day at this time and caffeine can interfere with your sleep.>>  Thank you very much for allowing me to participate in  the care of this nice patient. If I can be of any further assistance to you please do not hesitate to call me at [phone].  Sincerely,   True Mar, MD, PhD

## 2024-03-28 NOTE — Patient Instructions (Signed)
 It was nice to meet you today.  You have complaints of memory loss: memory loss or changes in cognitive function can have many reasons and does not always mean you have dementia.  There are several conditions and situations that can contribute to subjective or objective memory loss.  These factors include: depression, stress, sleep deprivation or poor sleep from insomnia or sleep apnea, dehydration, fluctuation in blood sugar values, thyroid  or electrolyte dysfunction, medication effects from sedating medications or narcotic pain medication for example and certain vitamin deficiencies such as vitamin B12 deficiency, and anemia. Dementia can be caused by stroke, brain atherosclerosis or brain vascular disease due to vascular risk factors (smoking, high blood pressure, high cholesterol, obesity and uncontrolled diabetes), certain degenerative brain disorders (including Parkinson's disease and Multiple sclerosis) and by Alzheimer's disease or other, more rare and sometimes hereditary causes.   Here is what I would recommend:   Repeat blood work (which you can have with Dr. Rosamond) to recheck your vitamin B12 level.  We will do a brain scan, called MRI and call you with the test results. We will have to schedule you for this on a separate date. This test requires authorization from your insurance, and we will take care of the insurance process. You can continue your medication for memory loss through Dr. Rosamond. You take several medications that can affect your memory function adversely: temazepam, pregabalin, and hydroxyzine. I recommend you talk to your prescribers about reducing or coming off these meds.    Please increase your water intake to 6-8 cups of water per day, spread throughout the day and reduce your caffeine intake to 1-2 servings per day. You have about 4 servings per day at this time and caffeine can interfere with your sleep.

## 2024-04-01 ENCOUNTER — Telehealth: Payer: Self-pay | Admitting: Neurology

## 2024-04-01 NOTE — Telephone Encounter (Signed)
 no auth required sent to [facility] [phone]

## 2024-04-05 ENCOUNTER — Ambulatory Visit (HOSPITAL_COMMUNITY)
Admission: RE | Admit: 2024-04-05 | Discharge: 2024-04-05 | Disposition: A | Discharge status: Home / Self Care | Source: Ambulatory Visit | Attending: Neurology | Admitting: Neurology

## 2024-04-05 DIAGNOSIS — G479 Sleep disorder, unspecified: Secondary | ICD-10-CM | POA: Insufficient documentation

## 2024-04-05 DIAGNOSIS — R413 Other amnesia: Secondary | ICD-10-CM | POA: Insufficient documentation

## 2024-04-05 DIAGNOSIS — Z789 Other specified health status: Secondary | ICD-10-CM | POA: Diagnosis not present

## 2024-04-05 DIAGNOSIS — D508 Other iron deficiency anemias: Secondary | ICD-10-CM | POA: Diagnosis present

## 2024-04-05 DIAGNOSIS — I6782 Cerebral ischemia: Secondary | ICD-10-CM | POA: Diagnosis not present

## 2024-04-05 DIAGNOSIS — G319 Degenerative disease of nervous system, unspecified: Secondary | ICD-10-CM | POA: Diagnosis not present

## 2024-04-05 MED ORDER — GADOBUTROL 1 MMOL/ML IV SOLN
9.0000 mL | Freq: Once | INTRAVENOUS | Status: AC | PRN
  Administered 2024-04-05: 9 mL via INTRAVENOUS

## 2024-04-08 ENCOUNTER — Ambulatory Visit: Payer: Self-pay | Admitting: Neurology

## 2024-04-11 NOTE — Telephone Encounter (Signed)
-----   Message from True Mar sent at 04/08/2024 10:25 AM EDT ----- Please call patient regarding the recent brain MRI: The brain scan showed a normal structure of the brain and moderate volume loss which we call atrophy. There were changes in the deeper structures  of the brain, which we call white matter changes or microvascular changes. These were reported as moderate in Her case. These are tiny white spots, that occur with time and are seen in a variety of  conditions, including with normal aging, chronic hypertension, chronic headaches, especially migraine HAs, chronic diabetes, chronic hyperlipidemia. These are not strokes and no mass or lesion or  abnormal contrast enhancement were seen, which is reassuring. Again, there were no acute findings, such as a stroke, or mass or blood products. No further action is required on this test at this  time, other than re-enforcing the importance of good blood pressure control, good cholesterol control, good blood sugar control, and weight management. Please remind patient to keep any upcoming  appointments or tests and to call us  with any interim questions, concerns, problems or updates. Thanks,  True Mar, MD, PhD   ----- Message ----- From: Interface, Rad Results In Sent: 04/07/2024  12:47 PM EDT To: True Mar, MD

## 2024-04-11 NOTE — Telephone Encounter (Signed)
 I relayed the result of your MRI to pt and she wanted the result to her mychart so her daughter could look at as well.  She did not have any questions at this time.  Appreciated the good news.  I told her would send to her daughter on mychart.  Done.

## 2024-04-25 ENCOUNTER — Inpatient Hospital Stay

## 2024-04-26 ENCOUNTER — Inpatient Hospital Stay: Attending: Hematology

## 2024-04-26 ENCOUNTER — Inpatient Hospital Stay

## 2024-04-26 VITALS — BP 124/71 | HR 58 | Temp 97.7°F | Resp 16

## 2024-04-26 DIAGNOSIS — E538 Deficiency of other specified B group vitamins: Secondary | ICD-10-CM | POA: Insufficient documentation

## 2024-04-26 DIAGNOSIS — D472 Monoclonal gammopathy: Secondary | ICD-10-CM

## 2024-04-26 DIAGNOSIS — D509 Iron deficiency anemia, unspecified: Secondary | ICD-10-CM | POA: Diagnosis not present

## 2024-04-26 DIAGNOSIS — D696 Thrombocytopenia, unspecified: Secondary | ICD-10-CM

## 2024-04-26 LAB — FERRITIN: Ferritin: 206 ng/mL (ref 11–307)

## 2024-04-26 LAB — CBC WITH DIFFERENTIAL/PLATELET
Abs Immature Granulocytes: 0.02 10*3/uL (ref 0.00–0.07)
Basophils Absolute: 0.1 10*3/uL (ref 0.0–0.1)
Basophils Relative: 1 %
Eosinophils Absolute: 0.2 10*3/uL (ref 0.0–0.5)
Eosinophils Relative: 3 %
HCT: 41 % (ref 36.0–46.0)
Hemoglobin: 13.4 g/dL (ref 12.0–15.0)
Immature Granulocytes: 0 %
Lymphocytes Relative: 21 %
Lymphs Abs: 1.5 10*3/uL (ref 0.7–4.0)
MCH: 30.1 pg (ref 26.0–34.0)
MCHC: 32.7 g/dL (ref 30.0–36.0)
MCV: 92.1 fL (ref 80.0–100.0)
Monocytes Absolute: 0.6 10*3/uL (ref 0.1–1.0)
Monocytes Relative: 7 %
Neutro Abs: 5 10*3/uL (ref 1.7–7.7)
Neutrophils Relative %: 68 %
Platelets: 142 10*3/uL — ABNORMAL LOW (ref 150–400)
RBC: 4.45 MIL/uL (ref 3.87–5.11)
RDW: 13.3 % (ref 11.5–15.5)
WBC: 7.4 10*3/uL (ref 4.0–10.5)
nRBC: 0 % (ref 0.0–0.2)

## 2024-04-26 LAB — IRON AND TIBC
Iron: 93 ug/dL (ref 28–170)
Saturation Ratios: 35 % — ABNORMAL HIGH (ref 10.4–31.8)
TIBC: 264 ug/dL (ref 250–450)
UIBC: 171 ug/dL

## 2024-04-26 MED ORDER — CYANOCOBALAMIN 1000 MCG/ML IJ SOLN
1000.0000 ug | Freq: Once | INTRAMUSCULAR | Status: AC
  Administered 2024-04-26: 1000 ug via INTRAMUSCULAR
  Filled 2024-04-26: qty 1

## 2024-04-26 NOTE — Progress Notes (Signed)
 Patient tolerated injection with no complaints voiced. Site clean and dry with no bruising or swelling noted at site. See MAR for details. Band aid applied.  Patient stable during and after injection. VSS with discharge and left in satisfactory condition with no s/s of distress noted.

## 2024-04-26 NOTE — Patient Instructions (Signed)
 [facility] [place] - A DEPT OF [facility]  Discharge Instructions: Thank you for choosing [facility] to provide your oncology and hematology care.  If you have a lab appointment with the Cancer Center - please note that after April 8th, 2024, all labs will be drawn in the cancer center.  You do not have to check in or register with the main entrance as you have in the past but will complete your check-in in the cancer center.  Wear comfortable clothing and clothing appropriate for easy access to any Portacath or PICC line.   We strive to give you quality time with your provider. You may need to reschedule your appointment if you arrive late (15 or more minutes).  Arriving late affects you and other patients whose appointments are after yours.  Also, if you miss three or more appointments without notifying the office, you may be dismissed from the clinic at the provider's discretion.      For prescription refill requests, have your pharmacy contact our office and allow 72 hours for refills to be completed.    Today you received the following B12 return as scheduled.   To help prevent nausea and vomiting after your treatment, we encourage you to take your nausea medication as directed.  BELOW ARE SYMPTOMS THAT SHOULD BE REPORTED IMMEDIATELY: *FEVER GREATER THAN 100.4 F (38 C) OR HIGHER *CHILLS OR SWEATING *NAUSEA AND VOMITING THAT IS NOT CONTROLLED WITH YOUR NAUSEA MEDICATION *UNUSUAL SHORTNESS OF BREATH *UNUSUAL BRUISING OR BLEEDING *URINARY PROBLEMS (pain or burning when urinating, or frequent urination) *BOWEL PROBLEMS (unusual diarrhea, constipation, pain near the anus) TENDERNESS IN MOUTH AND THROAT WITH OR WITHOUT PRESENCE OF ULCERS (sore throat, sores in mouth, or a toothache) UNUSUAL RASH, SWELLING OR PAIN  UNUSUAL VAGINAL DISCHARGE OR ITCHING   Items with * indicate a potential emergency and should be followed up as soon as possible or go to  the Emergency Department if any problems should occur.  Please show the CHEMOTHERAPY ALERT CARD or IMMUNOTHERAPY ALERT CARD at check-in to the Emergency Department and triage nurse.  Should you have questions after your visit or need to cancel or reschedule your appointment, please contact [facility] [place] - A DEPT OF [facility] [phone]  and follow the prompts.  Office hours are 8:00 a.m. to 4:30 p.m. Monday - Friday. Please note that voicemails left after 4:00 p.m. may not be returned until the following business day.  We are closed weekends and major holidays. You have access to a nurse at all times for urgent questions. Please call the main number to the clinic [phone] and follow the prompts.  For any non-urgent questions, you may also contact your provider using MyChart. We now offer e-Visits for anyone 33 and older to request care online for non-urgent symptoms. For details visit [email or web address].   Also download the MyChart app! Go to the app store, search "MyChart", open the app, select [facility], and log in with your MyChart username and password.

## 2024-04-30 ENCOUNTER — Other Ambulatory Visit: Payer: Self-pay | Admitting: Physician Assistant

## 2024-05-01 ENCOUNTER — Inpatient Hospital Stay: Admitting: Oncology

## 2024-05-01 DIAGNOSIS — D472 Monoclonal gammopathy: Secondary | ICD-10-CM

## 2024-05-01 DIAGNOSIS — D509 Iron deficiency anemia, unspecified: Secondary | ICD-10-CM | POA: Diagnosis not present

## 2024-05-01 DIAGNOSIS — D696 Thrombocytopenia, unspecified: Secondary | ICD-10-CM | POA: Diagnosis not present

## 2024-05-01 NOTE — Progress Notes (Signed)
 Virtual Visit via Telephone Note  I connected with Victoria Adkins on 05/01/24 at 11:45 AM EDT by telephone and verified that I am speaking with the correct person using two identifiers.  Location: Patient: Home Provider: Clinic    I discussed the limitations, risks, security and privacy concerns of performing an evaluation and management service by telephone and the availability of in person appointments. I also discussed with the patient that there may be a patient responsible charge related to this service. The patient expressed understanding and agreed to proceed.   History of Present Illness: Patient is a 73 year old female here for follow-up for iron deficiency anemia.  She was seen by me last on  03/15/2024.  She received 1 g INFeD on 02/27/2024 and she has been receiving monthly B12 shots since January 2025.  She has been tolerating both well.  Today is a checkup to make sure labs have improved after most recent iron infusion.  She is taking iron supplements daily.  She has not noticed any blood in her stools.  She eats a variety in her diet.  She denies any ice pica.  Energy levels are stable.  She feels pretty good.    Observations/Objective:  Review of Systems  Constitutional:  Positive for malaise/fatigue.  Respiratory:  Positive for shortness of breath.    Physical Exam Neurological:     Mental Status: She is alert and oriented to person, place, and time.    Assessment and Plan: 1. Iron deficiency anemia, unspecified iron deficiency anemia type (Primary) -She received 1 dose of INFeD on 02/27/2024 with great tolerance. -She has been receiving B12 shots monthly last given on 04/26/2024. -We discussed that her iron deficiency is likely secondary to malabsorption and possible slow GI bleed.  She reports she has 3 hiatal hernias but spoke to [facility] who did not recommend intervention at this time. -She has not had a colonoscopy since 2015. -Although, she has needed  several IV iron infusions, her hemoglobin is maintaining at this time.  I would recommend continuing to monitor and give IV iron as needed based on lab work. -She is currently on iron supplements daily.  Recommend she start taking with vitamin C or orange juice.  -She was in agreement and we will keep her appointment for October 2025. -Labs from 04/25/2024 showed continued stability of her hemoglobin at 13.4, ferritin 206 and iron saturations of 35%.  She does not need any additional IV iron at this time.    2.  Thrombocytopenia: -Will continue to monitor at this time. -Platelet count is mildly low at 142.  If it continues to trend down, will do formal workup.  3.  MGUS: -She is on every 80-month follow-up for her MGUS. -Previous labs from June were stable. -She does not need a 24-hour urine until after her next visit. -Most recent bone scan was from January 2025.  We will repeat this in January 2026.  Follow Up Instructions: Recommend moving her October appointment out to December and at that point, we will drawl her iron, B12 and MGUS labs.  Continue monthly B12 shots.    I discussed the assessment and treatment plan with the patient. The patient was provided an opportunity to ask questions and all were answered. The patient agreed with the plan and demonstrated an understanding of the instructions.   The patient was advised to call back or seek an in-person evaluation if the symptoms worsen or if the condition fails to improve as anticipated.  I provided 20 minutes of non-face-to-face time during this encounter.   Delon FORBES Hope, NP

## 2024-05-15 ENCOUNTER — Ambulatory Visit: Admitting: Internal Medicine

## 2024-05-16 DIAGNOSIS — E7800 Pure hypercholesterolemia, unspecified: Secondary | ICD-10-CM | POA: Diagnosis not present

## 2024-05-16 DIAGNOSIS — J449 Chronic obstructive pulmonary disease, unspecified: Secondary | ICD-10-CM | POA: Diagnosis not present

## 2024-05-16 DIAGNOSIS — Z0000 Encounter for general adult medical examination without abnormal findings: Secondary | ICD-10-CM | POA: Diagnosis not present

## 2024-05-16 DIAGNOSIS — G4700 Insomnia, unspecified: Secondary | ICD-10-CM | POA: Diagnosis not present

## 2024-05-16 DIAGNOSIS — Z299 Encounter for prophylactic measures, unspecified: Secondary | ICD-10-CM | POA: Diagnosis not present

## 2024-05-16 DIAGNOSIS — H6990 Unspecified Eustachian tube disorder, unspecified ear: Secondary | ICD-10-CM | POA: Diagnosis not present

## 2024-05-27 ENCOUNTER — Inpatient Hospital Stay: Attending: Hematology

## 2024-05-27 VITALS — BP 125/68 | HR 59 | Temp 97.8°F | Resp 16

## 2024-05-27 DIAGNOSIS — E538 Deficiency of other specified B group vitamins: Secondary | ICD-10-CM | POA: Insufficient documentation

## 2024-05-27 DIAGNOSIS — D509 Iron deficiency anemia, unspecified: Secondary | ICD-10-CM

## 2024-05-27 MED ORDER — CYANOCOBALAMIN 1000 MCG/ML IJ SOLN
1000.0000 ug | Freq: Once | INTRAMUSCULAR | Status: AC
  Administered 2024-05-27: 1000 ug via INTRAMUSCULAR
  Filled 2024-05-27: qty 1

## 2024-05-27 NOTE — Patient Instructions (Signed)

## 2024-05-27 NOTE — Progress Notes (Signed)
 Patient tolerated injection with no complaints voiced.  Site clean and dry with no bruising or swelling noted at site.  See MAR for details.  Band aid applied.  Patient stable during and after injection.  Vss with discharge and left in satisfactory condition with no s/s of distress noted.

## 2024-05-28 DIAGNOSIS — G629 Polyneuropathy, unspecified: Secondary | ICD-10-CM | POA: Diagnosis not present

## 2024-05-28 DIAGNOSIS — S90211A Contusion of right great toe with damage to nail, initial encounter: Secondary | ICD-10-CM | POA: Diagnosis not present

## 2024-05-28 DIAGNOSIS — L603 Nail dystrophy: Secondary | ICD-10-CM | POA: Diagnosis not present

## 2024-05-31 ENCOUNTER — Other Ambulatory Visit: Payer: Self-pay | Admitting: Internal Medicine

## 2024-05-31 DIAGNOSIS — Z1231 Encounter for screening mammogram for malignant neoplasm of breast: Secondary | ICD-10-CM

## 2024-06-03 DIAGNOSIS — E2839 Other primary ovarian failure: Secondary | ICD-10-CM | POA: Diagnosis not present

## 2024-06-04 DIAGNOSIS — Z23 Encounter for immunization: Secondary | ICD-10-CM | POA: Diagnosis not present

## 2024-06-04 DIAGNOSIS — Z299 Encounter for prophylactic measures, unspecified: Secondary | ICD-10-CM | POA: Diagnosis not present

## 2024-06-04 DIAGNOSIS — M810 Age-related osteoporosis without current pathological fracture: Secondary | ICD-10-CM | POA: Diagnosis not present

## 2024-06-04 DIAGNOSIS — J449 Chronic obstructive pulmonary disease, unspecified: Secondary | ICD-10-CM | POA: Diagnosis not present

## 2024-06-11 ENCOUNTER — Ambulatory Visit
Admission: RE | Admit: 2024-06-11 | Discharge: 2024-06-11 | Disposition: A | Discharge status: Home / Self Care | Source: Ambulatory Visit | Attending: Internal Medicine | Admitting: Internal Medicine

## 2024-06-11 DIAGNOSIS — Z1231 Encounter for screening mammogram for malignant neoplasm of breast: Secondary | ICD-10-CM

## 2024-06-26 ENCOUNTER — Encounter: Payer: Self-pay | Admitting: Oncology

## 2024-06-26 ENCOUNTER — Inpatient Hospital Stay: Attending: Hematology

## 2024-06-26 ENCOUNTER — Inpatient Hospital Stay

## 2024-06-26 ENCOUNTER — Inpatient Hospital Stay: Admitting: Oncology

## 2024-06-26 VITALS — BP 117/68 | HR 65 | Temp 98.3°F | Resp 18 | Wt 197.3 lb

## 2024-06-26 DIAGNOSIS — E538 Deficiency of other specified B group vitamins: Secondary | ICD-10-CM | POA: Insufficient documentation

## 2024-06-26 DIAGNOSIS — D509 Iron deficiency anemia, unspecified: Secondary | ICD-10-CM

## 2024-06-26 DIAGNOSIS — D500 Iron deficiency anemia secondary to blood loss (chronic): Secondary | ICD-10-CM

## 2024-06-26 DIAGNOSIS — R0602 Shortness of breath: Secondary | ICD-10-CM | POA: Diagnosis not present

## 2024-06-26 DIAGNOSIS — K449 Diaphragmatic hernia without obstruction or gangrene: Secondary | ICD-10-CM | POA: Diagnosis not present

## 2024-06-26 DIAGNOSIS — F32A Depression, unspecified: Secondary | ICD-10-CM | POA: Insufficient documentation

## 2024-06-26 DIAGNOSIS — G4700 Insomnia, unspecified: Secondary | ICD-10-CM | POA: Insufficient documentation

## 2024-06-26 DIAGNOSIS — R5383 Other fatigue: Secondary | ICD-10-CM | POA: Diagnosis not present

## 2024-06-26 DIAGNOSIS — D696 Thrombocytopenia, unspecified: Secondary | ICD-10-CM

## 2024-06-26 DIAGNOSIS — M810 Age-related osteoporosis without current pathological fracture: Secondary | ICD-10-CM | POA: Diagnosis not present

## 2024-06-26 DIAGNOSIS — D472 Monoclonal gammopathy: Secondary | ICD-10-CM

## 2024-06-26 DIAGNOSIS — K921 Melena: Secondary | ICD-10-CM | POA: Diagnosis not present

## 2024-06-26 LAB — FERRITIN: Ferritin: 321 ng/mL — ABNORMAL HIGH (ref 11–307)

## 2024-06-26 LAB — CBC WITH DIFFERENTIAL/PLATELET
Abs Immature Granulocytes: 0.04 10*3/uL (ref 0.00–0.07)
Basophils Absolute: 0.1 10*3/uL (ref 0.0–0.1)
Basophils Relative: 1 %
Eosinophils Absolute: 0.2 10*3/uL (ref 0.0–0.5)
Eosinophils Relative: 2 %
HCT: 40.4 % (ref 36.0–46.0)
Hemoglobin: 13.1 g/dL (ref 12.0–15.0)
Immature Granulocytes: 1 %
Lymphocytes Relative: 18 %
Lymphs Abs: 1.6 10*3/uL (ref 0.7–4.0)
MCH: 30 pg (ref 26.0–34.0)
MCHC: 32.4 g/dL (ref 30.0–36.0)
MCV: 92.4 fL (ref 80.0–100.0)
Monocytes Absolute: 0.6 10*3/uL (ref 0.1–1.0)
Monocytes Relative: 7 %
Neutro Abs: 6.2 10*3/uL (ref 1.7–7.7)
Neutrophils Relative %: 71 %
Platelets: 170 10*3/uL (ref 150–400)
RBC: 4.37 MIL/uL (ref 3.87–5.11)
RDW: 13.1 % (ref 11.5–15.5)
WBC: 8.6 10*3/uL (ref 4.0–10.5)
nRBC: 0 % (ref 0.0–0.2)

## 2024-06-26 LAB — LACTATE DEHYDROGENASE: LDH: 185 U/L (ref 98–192)

## 2024-06-26 LAB — COMPREHENSIVE METABOLIC PANEL WITH GFR
ALT: 15 U/L (ref 0–44)
AST: 20 U/L (ref 15–41)
Albumin: 4.4 g/dL (ref 3.5–5.0)
Alkaline Phosphatase: 78 U/L (ref 38–126)
Anion gap: 16 — ABNORMAL HIGH (ref 5–15)
BUN: 18 mg/dL (ref 8–23)
CO2: 24 mmol/L (ref 22–32)
Calcium: 9.7 mg/dL (ref 8.9–10.3)
Chloride: 104 mmol/L (ref 98–111)
Creatinine, Ser: 1.09 mg/dL — ABNORMAL HIGH (ref 0.44–1.00)
GFR, Estimated: 53 mL/min — ABNORMAL LOW
Glucose, Bld: 121 mg/dL — ABNORMAL HIGH (ref 70–99)
Potassium: 4 mmol/L (ref 3.5–5.1)
Sodium: 144 mmol/L (ref 135–145)
Total Bilirubin: 0.4 mg/dL (ref 0.0–1.2)
Total Protein: 6.8 g/dL (ref 6.5–8.1)

## 2024-06-26 LAB — IRON AND TIBC
Iron: 80 ug/dL (ref 28–170)
Saturation Ratios: 30 % (ref 10.4–31.8)
TIBC: 267 ug/dL (ref 250–450)
UIBC: 188 ug/dL

## 2024-06-26 MED ORDER — CYANOCOBALAMIN 1000 MCG/ML IJ SOLN
1000.0000 ug | Freq: Once | INTRAMUSCULAR | Status: AC
  Administered 2024-06-26: 1000 ug via INTRAMUSCULAR
  Filled 2024-06-26: qty 1

## 2024-06-26 NOTE — Progress Notes (Signed)
 [facility] OFFICE PROGRESS NOTE  Victoria Leta NOVAK, MD  ASSESSMENT & PLAN:    Assessment & Plan Iron  deficiency anemia due to chronic blood loss -She received 1 dose of INFeD  on 02/27/2024 with great tolerance. -She has been receiving B12 shots monthly last given on 05/27/2024. -We discussed that her iron  deficiency is likely secondary to malabsorption and possible slow GI bleed.  She reports she has 3 hiatal hernias but spoke to [facility] gastroenterology who did not recommend intervention at this time. -She has not had a colonoscopy since 2015. -Although, she has needed several IV iron  infusions, her hemoglobin is maintaining at this time.   -She is currently on iron  supplements daily with vitamin C. -Labs from today show a stable hemoglobin, iron  saturation is 30% with an elevated ferritin. - No additional iron  is not needed at this time.   Thrombocytopenia -Will continue to monitor at this time. --Platelet count has normalized. MGUS (monoclonal gammopathy of unknown significance) -She is on every 86-month follow-up for her MGUS. -Previous labs from June were stable. -She does not need a 24-hour urine until after her next visit. -Most recent bone scan was from January 2025.  We will repeat this in January 2026. -MGUS labs are pending during dictation.  Will call with results. -Did discuss no crab criteria.  Calcium, hemoglobin and creatinine more or less stable.  No new bone pain.  Orders Placed This Encounter  Procedures   DG Bone Survey Met    Standing Status:   Future    Expected Date:   09/26/2024    Expiration Date:   06/26/2025    Reason for Exam (SYMPTOM  OR DIAGNOSIS REQUIRED):   mspike    Preferred imaging location?:   [facility]   24 hr Ur UPEP/UIFE/Light Chains/TP    Standing Status:   Future    Expected Date:   09/26/2024    Expiration Date:   06/26/2025   Ferritin    Standing Status:   Future    Expected Date:   10/29/2024    Expiration Date:    06/26/2025   Iron  and TIBC ([facility] ONLY)    Standing Status:   Future    Expected Date:   10/29/2024    Expiration Date:   06/26/2025   CBC with Differential    Standing Status:   Future    Expected Date:   10/29/2024    Expiration Date:   06/26/2025   Lactate dehydrogenase    Standing Status:   Future    Expected Date:   10/29/2024    Expiration Date:   06/26/2025   Comprehensive metabolic panel with GFR    Standing Status:   Future    Expected Date:   10/29/2024    Expiration Date:   06/26/2025    INTERVAL HISTORY: Patient is a 73 year old female here for follow-up for iron  deficiency anemia. She received 1 g INFeD  on 02/27/2024 and she has been receiving monthly B12 shots since January 2025.  She has been tolerating both well.   She is taking iron  supplements daily.  She has not noticed any blood in her stools.  She eats a variety in her diet.  She denies any ice pica.  Energy levels are stable.  She feels pretty good.  Reports she recently sold her house and moved into a condo in [place].  Reports this is going well.  Has some chronic stable fatigue and energy levels are low.  She has shortness of breath with exertion.  She is planning on joining the [employer] next couple of weeks to help get herself out of the house.  She has some mild depression secondary to recent falling out with her daughter who lives in [place].  Reports she tries to stay active and do some sort of activity daily.  She recently had a bone density scan and was told she had osteoporosis.  She was started on Fosamax but she was unable to tolerate.  She is currently taking vitamin D and calcium tablets.  No recent fractures.   We reviewed CBC, CMP, LDH, myeloma labs, iron  panel and ferritin.  SUMMARY OF HEMATOLOGIC HISTORY: Oncology History   No history exists.     CBC    Component Value Date/Time   WBC 8.6 06/26/2024 1028   RBC 4.37 06/26/2024 1028   HGB 13.1 06/26/2024 1028   HCT 40.4  06/26/2024 1028   PLT 170 06/26/2024 1028   MCV 92.4 06/26/2024 1028   MCH 30.0 06/26/2024 1028   MCHC 32.4 06/26/2024 1028   RDW 13.1 06/26/2024 1028   LYMPHSABS 1.6 06/26/2024 1028   MONOABS 0.6 06/26/2024 1028   EOSABS 0.2 06/26/2024 1028   BASOSABS 0.1 06/26/2024 1028       Latest Ref Rng & Units 06/26/2024   10:28 AM 03/16/2021    3:07 PM 06/26/2018    2:39 PM  CMP  Glucose 70 - 99 mg/dL 878  99  89   BUN 8 - 23 mg/dL 18  21  20    Creatinine 0.44 - 1.00 mg/dL 8.90  9.04  9.10   Sodium 135 - 145 mmol/L 144  137  136   Potassium 3.5 - 5.1 mmol/L 4.0  4.1  4.0   Chloride 98 - 111 mmol/L 104  104  99   CO2 22 - 32 mmol/L 24  26  30    Calcium 8.9 - 10.3 mg/dL 9.7  8.9  9.7   Total Protein 6.5 - 8.1 g/dL 6.8     Total Bilirubin 0.0 - 1.2 mg/dL 0.4     Alkaline Phos 38 - 126 U/L 78     AST 15 - 41 U/L 20     ALT 0 - 44 U/L 15        Lab Results  Component Value Date   FERRITIN 321 (H) 06/26/2024   VITAMINB12 313 09/04/2023    Vitals:   06/26/24 1106  BP: 117/68  Pulse: 65  Resp: 18  Temp: 98.3 F (36.8 C)  SpO2: 94%    Review of System:  Review of Systems  Constitutional:  Positive for malaise/fatigue.  Respiratory:  Positive for shortness of breath.   Gastrointestinal:  Positive for blood in stool.  Psychiatric/Behavioral:  Positive for depression. The patient is nervous/anxious and has insomnia.     Physical Exam: Physical Exam Constitutional:      Appearance: Normal appearance.  HENT:     Head: Normocephalic and atraumatic.  Eyes:     Pupils: Pupils are equal, round, and reactive to light.  Cardiovascular:     Rate and Rhythm: Normal rate and regular rhythm.     Heart sounds: Normal heart sounds. No murmur heard. Pulmonary:     Effort: Pulmonary effort is normal.     Breath sounds: Normal breath sounds. No wheezing.  Abdominal:     General: Bowel sounds are normal. There is no distension.     Palpations: Abdomen is soft.  Tenderness: There  is no abdominal tenderness.  Musculoskeletal:        General: Normal range of motion.     Cervical back: Normal range of motion.  Skin:    General: Skin is warm and dry.     Findings: No rash.  Neurological:     Mental Status: She is alert and oriented to person, place, and time.     Gait: Gait is intact.  Psychiatric:        Mood and Affect: Mood and affect normal.        Cognition and Memory: Memory normal.        Judgment: Judgment normal.      I spent 25 minutes dedicated to the care of this patient (face-to-face and non-face-to-face) on the date of the encounter to include what is described in the assessment and plan.,  Delon Hope, NP 06/26/2024 12:58 PM

## 2024-06-26 NOTE — Patient Instructions (Signed)
 Vitamin B12 Injection What is this medication? Vitamin B12 (VAHY tuh min B12) prevents and treats low vitamin B12 levels in your body. It is used in people who do not get enough vitamin B12 from their diet or when their digestive tract does not absorb enough. Vitamin B12 plays an important role in maintaining the health of your nervous system and red blood cells. This medicine may be used for other purposes; ask your health care provider or pharmacist if you have questions. COMMON BRAND NAME(S): B-12 Compliance Kit, B-12 Injection Kit, Cyomin, Dodex , LA-12, Nutri-Twelve, Physicians EZ Use B-12, Primabalt, Vitamin Deficiency Injectable System - B12 What should I tell my care team before I take this medication? They need to know if you have any of these conditions: Kidney disease Leber's disease Megaloblastic anemia An unusual or allergic reaction to cyanocobalamin , cobalt, other medications, foods, dyes, or preservatives Pregnant or trying to get pregnant Breast-feeding How should I use this medication? This medication is injected into a muscle or deeply under the skin. It is usually given in a clinic or care team's office. However, your care team may teach you how to inject yourself. Follow all instructions. Talk to your care team about the use of this medication in children. Special care may be needed. Overdosage: If you think you have taken too much of this medicine contact a poison control center or emergency room at once. NOTE: This medicine is only for you. Do not share this medicine with others. What if I miss a dose? If you are given your dose at a clinic or care team's office, call to reschedule your appointment. If you give your own injections, and you miss a dose, take it as soon as you can. If it is almost time for your next dose, take only that dose. Do not take double or extra doses. What may interact with this medication? Alcohol Colchicine This list may not describe all possible  interactions. Give your health care provider a list of all the medicines, herbs, non-prescription drugs, or dietary supplements you use. Also tell them if you smoke, drink alcohol, or use illegal drugs. Some items may interact with your medicine. What should I watch for while using this medication? Visit your care team regularly. You may need blood work done while you are taking this medication. You may need to follow a special diet. Talk to your care team. Limit your alcohol intake and avoid smoking to get the best benefit. What side effects may I notice from receiving this medication? Side effects that you should report to your care team as soon as possible: Allergic reactions--skin rash, itching, hives, swelling of the face, lips, tongue, or throat Swelling of the ankles, hands, or feet Trouble breathing Side effects that usually do not require medical attention (report to your care team if they continue or are bothersome): Diarrhea This list may not describe all possible side effects. Call your doctor for medical advice about side effects. You may report side effects to FDA at 1-800-FDA-1088. Where should I keep my medication? Keep out of the reach of children. Store at room temperature between 15 and 30 degrees C (59 and 85 degrees F). Protect from light. Throw away any unused medication after the expiration date. NOTE: This sheet is a summary. It may not cover all possible information. If you have questions about this medicine, talk to your doctor, pharmacist, or health care provider.  2024 Elsevier/Gold Standard (2021-05-18 00:00:00)

## 2024-06-26 NOTE — Assessment & Plan Note (Deleted)
-  She received 1 dose of INFeD  on 02/27/2024 with great tolerance. -She has been receiving B12 shots monthly last given on 05/27/2024. -We discussed that her iron  deficiency is likely secondary to malabsorption and possible slow GI bleed.  She reports she has 3 hiatal hernias but spoke to [facility] gastroenterology who did not recommend intervention at this time. -She has not had a colonoscopy since 2015. -Although, she has needed several IV iron  infusions, her hemoglobin is maintaining at this time.   -She is currently on iron  supplements daily.  Recommend she start taking with vitamin C or orange juice.  -Labs from today show a stable hemoglobin.  Iron  levels are pending. -Additional iron  is not needed at this time.

## 2024-06-26 NOTE — Assessment & Plan Note (Addendum)
-  She received 1 dose of INFeD  on 02/27/2024 with great tolerance. -She has been receiving B12 shots monthly last given on 05/27/2024. -We discussed that her iron  deficiency is likely secondary to malabsorption and possible slow GI bleed.  She reports she has 3 hiatal hernias but spoke to [facility] gastroenterology who did not recommend intervention at this time. -She has not had a colonoscopy since 2015. -Although, she has needed several IV iron  infusions, her hemoglobin is maintaining at this time.   -She is currently on iron  supplements daily with vitamin C. -Labs from today show a stable hemoglobin, iron  saturation is 30% with an elevated ferritin. - No additional iron  is not needed at this time.

## 2024-06-26 NOTE — Assessment & Plan Note (Addendum)
-  She is on every 48-month follow-up for her MGUS. -Previous labs from June were stable. -She does not need a 24-hour urine until after her next visit. -Most recent bone scan was from January 2025.  We will repeat this in January 2026. -MGUS labs are pending during dictation.  Will call with results. -Did discuss no crab criteria.  Calcium, hemoglobin and creatinine more or less stable.  No new bone pain.

## 2024-06-26 NOTE — Progress Notes (Signed)
 Patient tolerated B12 injection with no complaints voiced.  Site clean and dry with no bruising or swelling noted at site.  See MAR for details.  Band aid applied.  Patient stable during and after injection.  Vss with discharge and left in satisfactory condition with no s/s of distress noted. All follow ups as scheduled.   Victoria Adkins

## 2024-06-26 NOTE — Assessment & Plan Note (Addendum)
-  Will continue to monitor at this time. --Platelet count has normalized.

## 2024-06-27 LAB — KAPPA/LAMBDA LIGHT CHAINS
Kappa free light chain: 26.2 mg/L — ABNORMAL HIGH (ref 3.3–19.4)
Kappa, lambda light chain ratio: 1.24 (ref 0.26–1.65)
Lambda free light chains: 21.1 mg/L (ref 5.7–26.3)

## 2024-06-28 ENCOUNTER — Inpatient Hospital Stay: Admitting: Oncology

## 2024-06-29 LAB — PROTEIN ELECTROPHORESIS, SERUM
A/G Ratio: 1.4 (ref 0.7–1.7)
Albumin ELP: 3.8 g/dL (ref 2.9–4.4)
Alpha-1-Globulin: 0.2 g/dL (ref 0.0–0.4)
Alpha-2-Globulin: 0.7 g/dL (ref 0.4–1.0)
Beta Globulin: 1 g/dL (ref 0.7–1.3)
Gamma Globulin: 0.7 g/dL (ref 0.4–1.8)
Globulin, Total: 2.7 g/dL (ref 2.2–3.9)
Total Protein ELP: 6.5 g/dL (ref 6.0–8.5)

## 2024-07-02 ENCOUNTER — Other Ambulatory Visit: Payer: Self-pay | Admitting: Cardiology

## 2024-07-03 ENCOUNTER — Inpatient Hospital Stay: Admission: RE | Admit: 2024-07-03 | Source: Ambulatory Visit

## 2024-07-23 ENCOUNTER — Ambulatory Visit
Admission: RE | Admit: 2024-07-23 | Discharge: 2024-07-23 | Disposition: A | Discharge status: Home / Self Care | Source: Ambulatory Visit

## 2024-07-23 DIAGNOSIS — Z1231 Encounter for screening mammogram for malignant neoplasm of breast: Secondary | ICD-10-CM | POA: Diagnosis not present

## 2024-07-24 DIAGNOSIS — H401211 Low-tension glaucoma, right eye, mild stage: Secondary | ICD-10-CM | POA: Diagnosis not present

## 2024-07-26 ENCOUNTER — Inpatient Hospital Stay: Attending: Hematology

## 2024-07-26 VITALS — BP 134/64 | HR 70 | Temp 97.5°F | Resp 18

## 2024-07-26 DIAGNOSIS — E538 Deficiency of other specified B group vitamins: Secondary | ICD-10-CM | POA: Diagnosis present

## 2024-07-26 DIAGNOSIS — D509 Iron deficiency anemia, unspecified: Secondary | ICD-10-CM

## 2024-07-26 LAB — GLUCOSE, CAPILLARY: Glucose-Capillary: 101 mg/dL — ABNORMAL HIGH (ref 70–99)

## 2024-07-26 MED ORDER — CYANOCOBALAMIN 1000 MCG/ML IJ SOLN
1000.0000 ug | Freq: Once | INTRAMUSCULAR | Status: AC
  Administered 2024-07-26: 1000 ug via INTRAMUSCULAR
  Filled 2024-07-26: qty 1

## 2024-07-26 NOTE — Patient Instructions (Signed)
 [facility] [place] - A DEPT OF [facility]  Discharge Instructions: Thank you for choosing [facility] to provide your oncology and hematology care.  If you have a lab appointment with the Cancer Center - please note that after April 8th, 2024, all labs will be drawn in the cancer center.  You do not have to check in or register with the main entrance as you have in the past but will complete your check-in in the cancer center.  Wear comfortable clothing and clothing appropriate for easy access to any Portacath or PICC line.   We strive to give you quality time with your provider. You may need to reschedule your appointment if you arrive late (15 or more minutes).  Arriving late affects you and other patients whose appointments are after yours.  Also, if you miss three or more appointments without notifying the office, you may be dismissed from the clinic at the provider's discretion.      For prescription refill requests, have your pharmacy contact our office and allow 72 hours for refills to be completed.    Today you received the following B12 return as scheduled.   To help prevent nausea and vomiting after your treatment, we encourage you to take your nausea medication as directed.  BELOW ARE SYMPTOMS THAT SHOULD BE REPORTED IMMEDIATELY: *FEVER GREATER THAN 100.4 F (38 C) OR HIGHER *CHILLS OR SWEATING *NAUSEA AND VOMITING THAT IS NOT CONTROLLED WITH YOUR NAUSEA MEDICATION *UNUSUAL SHORTNESS OF BREATH *UNUSUAL BRUISING OR BLEEDING *URINARY PROBLEMS (pain or burning when urinating, or frequent urination) *BOWEL PROBLEMS (unusual diarrhea, constipation, pain near the anus) TENDERNESS IN MOUTH AND THROAT WITH OR WITHOUT PRESENCE OF ULCERS (sore throat, sores in mouth, or a toothache) UNUSUAL RASH, SWELLING OR PAIN  UNUSUAL VAGINAL DISCHARGE OR ITCHING   Items with * indicate a potential emergency and should be followed up as soon as possible or go to  the Emergency Department if any problems should occur.  Please show the CHEMOTHERAPY ALERT CARD or IMMUNOTHERAPY ALERT CARD at check-in to the Emergency Department and triage nurse.  Should you have questions after your visit or need to cancel or reschedule your appointment, please contact [facility] [place] - A DEPT OF [facility] [phone]  and follow the prompts.  Office hours are 8:00 a.m. to 4:30 p.m. Monday - Friday. Please note that voicemails left after 4:00 p.m. may not be returned until the following business day.  We are closed weekends and major holidays. You have access to a nurse at all times for urgent questions. Please call the main number to the clinic [phone] and follow the prompts.  For any non-urgent questions, you may also contact your provider using MyChart. We now offer e-Visits for anyone 33 and older to request care online for non-urgent symptoms. For details visit [email or web address].   Also download the MyChart app! Go to the app store, search "MyChart", open the app, select [facility], and log in with your MyChart username and password.

## 2024-07-26 NOTE — Progress Notes (Signed)
 Patient presents today for B12 injection, patient states she just feels shaky patient's glucose and BP stable, patient reports she is going to go home and rest. Patient tolerated injection with no complaints voiced. Site clean and dry with no bruising or swelling noted at site. See MAR for details. Band aid applied.  Patient stable during and after injection. VSS with discharge and left in satisfactory condition with no s/s of distress noted.

## 2024-08-17 ENCOUNTER — Other Ambulatory Visit: Payer: Self-pay | Admitting: Physician Assistant

## 2024-08-26 ENCOUNTER — Inpatient Hospital Stay

## 2024-08-28 ENCOUNTER — Inpatient Hospital Stay: Attending: Hematology

## 2024-08-28 VITALS — BP 125/78 | HR 69 | Temp 96.7°F | Resp 20

## 2024-08-28 DIAGNOSIS — E538 Deficiency of other specified B group vitamins: Secondary | ICD-10-CM | POA: Diagnosis present

## 2024-08-28 DIAGNOSIS — D509 Iron deficiency anemia, unspecified: Secondary | ICD-10-CM

## 2024-08-28 MED ORDER — CYANOCOBALAMIN 1000 MCG/ML IJ SOLN
1000.0000 ug | Freq: Once | INTRAMUSCULAR | Status: AC
  Administered 2024-08-28: 1000 ug via INTRAMUSCULAR
  Filled 2024-08-28: qty 1

## 2024-08-28 NOTE — Patient Instructions (Signed)
 [facility] [place] [facility]  Discharge Instructions: Thank you for choosing [facility] to provide your oncology and hematology care.  If you have a lab appointment with the Cancer Center - please note that after April 8th, 2024, all labs will be drawn in the cancer center.  You do not have to check in or register with the main entrance as you have in the past but will complete your check-in in the cancer center.  Wear comfortable clothing and clothing appropriate for easy access to any Portacath or PICC line.   We strive to give you quality time with your provider. You may need to reschedule your appointment if you arrive late (15 or more minutes).  Arriving late affects you and other patients whose appointments are after yours.  Also, if you miss three or more appointments without notifying the office, you may be dismissed from the clinic at the provider's discretion.      For prescription refill requests, have your pharmacy contact our office and allow 72 hours for refills to be completed.    Today you received B12 injection     BELOW ARE SYMPTOMS THAT SHOULD BE REPORTED IMMEDIATELY: *FEVER GREATER THAN 100.4 F (38 C) OR HIGHER *CHILLS OR SWEATING *NAUSEA AND VOMITING THAT IS NOT CONTROLLED WITH YOUR NAUSEA MEDICATION *UNUSUAL SHORTNESS OF BREATH *UNUSUAL BRUISING OR BLEEDING *URINARY PROBLEMS (pain or burning when urinating, or frequent urination) *BOWEL PROBLEMS (unusual diarrhea, constipation, pain near the anus) TENDERNESS IN MOUTH AND THROAT WITH OR WITHOUT PRESENCE OF ULCERS (sore throat, sores in mouth, or a toothache) UNUSUAL RASH, SWELLING OR PAIN  UNUSUAL VAGINAL DISCHARGE OR ITCHING   Items with * indicate a potential emergency and should be followed up as soon as possible or go to the Emergency Department if any problems should occur.  Please show the CHEMOTHERAPY ALERT CARD or IMMUNOTHERAPY ALERT CARD at check-in to  the Emergency Department and triage nurse.  Should you have questions after your visit or need to cancel or reschedule your appointment, please contact [facility] [place] - A DEPT OF Eligha Bridegroom [facility] [phone]  and follow the prompts.  Office hours are 8:00 a.m. to 4:30 p.m. Monday - Friday. Please note that voicemails left after 4:00 p.m. may not be returned until the following business day.  We are closed weekends and major holidays. You have access to a nurse at all times for urgent questions. Please call the main number to the clinic [phone] and follow the prompts.  For any non-urgent questions, you may also contact your provider using MyChart. We now offer e-Visits for anyone 57 and older to request care online for non-urgent symptoms. For details visit [email or web address].   Also download the MyChart app! Go to the app store, search "MyChart", open the app, select [place], and log in with your MyChart username and password.

## 2024-08-28 NOTE — Progress Notes (Signed)
 Victoria Adkins presents today for injection per the provider's orders.  B12 administration without incident; injection site WNL; see MAR for injection details.  Patient tolerated procedure well and without incident.  No questions or complaints noted at this time.   Treatment given today per MD orders. Tolerated infusion without adverse affects. Vital signs stable. No complaints at this time. Discharged from clinic ambulatory in stable condition. Alert and oriented x 3. F/U with [facility] as scheduled.

## 2024-08-29 ENCOUNTER — Inpatient Hospital Stay

## 2024-08-29 DIAGNOSIS — L603 Nail dystrophy: Secondary | ICD-10-CM | POA: Diagnosis not present

## 2024-08-30 ENCOUNTER — Inpatient Hospital Stay: Admitting: Oncology

## 2024-09-16 ENCOUNTER — Encounter: Payer: Self-pay | Admitting: *Deleted

## 2024-09-18 ENCOUNTER — Inpatient Hospital Stay

## 2024-09-20 ENCOUNTER — Inpatient Hospital Stay: Attending: Oncology

## 2024-09-20 DIAGNOSIS — E538 Deficiency of other specified B group vitamins: Secondary | ICD-10-CM | POA: Diagnosis present

## 2024-09-20 DIAGNOSIS — M810 Age-related osteoporosis without current pathological fracture: Secondary | ICD-10-CM | POA: Diagnosis not present

## 2024-09-20 DIAGNOSIS — D696 Thrombocytopenia, unspecified: Secondary | ICD-10-CM | POA: Insufficient documentation

## 2024-09-20 DIAGNOSIS — D509 Iron deficiency anemia, unspecified: Secondary | ICD-10-CM

## 2024-09-20 DIAGNOSIS — D472 Monoclonal gammopathy: Secondary | ICD-10-CM | POA: Diagnosis not present

## 2024-09-20 LAB — FERRITIN: Ferritin: 254 ng/mL (ref 11–307)

## 2024-09-20 LAB — CBC WITH DIFFERENTIAL/PLATELET
Abs Immature Granulocytes: 0.05 K/uL (ref 0.00–0.07)
Basophils Absolute: 0.1 K/uL (ref 0.0–0.1)
Basophils Relative: 1 %
Eosinophils Absolute: 0.2 K/uL (ref 0.0–0.5)
Eosinophils Relative: 2 %
HCT: 41.9 % (ref 36.0–46.0)
Hemoglobin: 13.8 g/dL (ref 12.0–15.0)
Immature Granulocytes: 1 %
Lymphocytes Relative: 22 %
Lymphs Abs: 1.9 K/uL (ref 0.7–4.0)
MCH: 30.1 pg (ref 26.0–34.0)
MCHC: 32.9 g/dL (ref 30.0–36.0)
MCV: 91.3 fL (ref 80.0–100.0)
Monocytes Absolute: 0.5 K/uL (ref 0.1–1.0)
Monocytes Relative: 6 %
Neutro Abs: 5.6 K/uL (ref 1.7–7.7)
Neutrophils Relative %: 68 %
Platelets: 124 K/uL — ABNORMAL LOW (ref 150–400)
RBC: 4.59 MIL/uL (ref 3.87–5.11)
RDW: 13.3 % (ref 11.5–15.5)
WBC: 8.4 K/uL (ref 4.0–10.5)
nRBC: 0 % (ref 0.0–0.2)

## 2024-09-20 LAB — VITAMIN B12: Vitamin B-12: 935 pg/mL — ABNORMAL HIGH (ref 180–914)

## 2024-09-20 LAB — COMPREHENSIVE METABOLIC PANEL WITH GFR
ALT: 16 U/L (ref 0–44)
AST: 26 U/L (ref 15–41)
Albumin: 4.5 g/dL (ref 3.5–5.0)
Alkaline Phosphatase: 74 U/L (ref 38–126)
Anion gap: 14 (ref 5–15)
BUN: 19 mg/dL (ref 8–23)
CO2: 24 mmol/L (ref 22–32)
Calcium: 9.6 mg/dL (ref 8.9–10.3)
Chloride: 104 mmol/L (ref 98–111)
Creatinine, Ser: 0.94 mg/dL (ref 0.44–1.00)
GFR, Estimated: >60 mL/min
Glucose, Bld: 98 mg/dL (ref 70–99)
Potassium: 4 mmol/L (ref 3.5–5.1)
Sodium: 142 mmol/L (ref 135–145)
Total Bilirubin: 0.4 mg/dL (ref 0.0–1.2)
Total Protein: 7.1 g/dL (ref 6.5–8.1)

## 2024-09-20 LAB — IRON AND TIBC
Iron: 76 ug/dL (ref 28–170)
Saturation Ratios: 26 % (ref 10.4–31.8)
TIBC: 291 ug/dL (ref 250–450)
UIBC: 216 ug/dL

## 2024-09-20 LAB — LACTATE DEHYDROGENASE: LDH: 231 U/L (ref 105–235)

## 2024-09-23 LAB — METHYLMALONIC ACID, SERUM: Methylmalonic Acid, Quantitative: 361 nmol/L (ref 0–378)

## 2024-09-23 LAB — KAPPA/LAMBDA LIGHT CHAINS
Kappa free light chain: 23.5 mg/L — ABNORMAL HIGH (ref 3.3–19.4)
Kappa, lambda light chain ratio: 1.33 (ref 0.26–1.65)
Lambda free light chains: 17.7 mg/L (ref 5.7–26.3)

## 2024-09-24 LAB — PROTEIN ELECTROPHORESIS, SERUM
A/G Ratio: 1.5 (ref 0.7–1.7)
Albumin ELP: 3.9 g/dL (ref 2.9–4.4)
Alpha-1-Globulin: 0.3 g/dL (ref 0.0–0.4)
Alpha-2-Globulin: 0.7 g/dL (ref 0.4–1.0)
Beta Globulin: 1 g/dL (ref 0.7–1.3)
Gamma Globulin: 0.7 g/dL (ref 0.4–1.8)
Globulin, Total: 2.6 g/dL (ref 2.2–3.9)
M-Spike, %: 0.2 g/dL — ABNORMAL HIGH
Total Protein ELP: 6.5 g/dL (ref 6.0–8.5)

## 2024-09-26 ENCOUNTER — Encounter: Payer: Self-pay | Admitting: Oncology

## 2024-09-26 ENCOUNTER — Inpatient Hospital Stay

## 2024-09-26 ENCOUNTER — Inpatient Hospital Stay: Admitting: Oncology

## 2024-09-26 VITALS — BP 133/67 | HR 64 | Temp 98.7°F | Resp 18 | Ht 66.0 in | Wt 199.0 lb

## 2024-09-26 DIAGNOSIS — E538 Deficiency of other specified B group vitamins: Secondary | ICD-10-CM | POA: Insufficient documentation

## 2024-09-26 DIAGNOSIS — D696 Thrombocytopenia, unspecified: Secondary | ICD-10-CM

## 2024-09-26 DIAGNOSIS — D508 Other iron deficiency anemias: Secondary | ICD-10-CM

## 2024-09-26 DIAGNOSIS — D472 Monoclonal gammopathy: Secondary | ICD-10-CM | POA: Diagnosis not present

## 2024-09-26 NOTE — Assessment & Plan Note (Addendum)
-  She received 1 dose of INFeD  on 02/27/2024 with great tolerance. -She has been receiving B12 shots monthly last given on 08/28/2024. -We discussed that her iron  deficiency is likely secondary to malabsorption and possible slow GI bleed.  She reports she has 3 hiatal hernias but spoke to [facility] gastroenterology who did not recommend intervention at this time. -She has not had a colonoscopy since 2015. -Although, she has needed several IV iron  infusions, her hemoglobin is maintaining at this time.   -She is currently on iron  supplements daily with vitamin C. -Labs from today show a stable hemoglobin, iron  saturation is 26% with a ferritin of 254. - No additional iron  is not needed at this time.

## 2024-09-26 NOTE — Assessment & Plan Note (Addendum)
 She has been receiving B12 shots over the last 6 months or so. Most recent B12 levels are greater than 900. Recommend stopping B12 shots and continuing B12 orally 1000 mcg daily. Will recheck labs in 3 months and see if we need to add back and B12 shots.

## 2024-09-26 NOTE — Progress Notes (Signed)
 "  [facility] OFFICE PROGRESS NOTE  Victoria Adkins NOVAK, MD  ASSESSMENT & PLAN:    Assessment & Plan Other iron  deficiency anemia -She received 1 dose of INFeD  on 02/27/2024 with great tolerance. -She has been receiving B12 shots monthly last given on 08/28/2024. -We discussed that her iron  deficiency is likely secondary to malabsorption and possible slow GI bleed.  She reports she has 3 hiatal hernias but spoke to [facility] gastroenterology who did not recommend intervention at this time. -She has not had a colonoscopy since 2015. -Although, she has needed several IV iron  infusions, her hemoglobin is maintaining at this time.   -She is currently on iron  supplements daily with vitamin C. -Labs from today show a stable hemoglobin, iron  saturation is 26% with a ferritin of 254. - No additional iron  is not needed at this time.   Thrombocytopenia -Will continue to monitor at this time. -Slight decline in platelet count at 124,000.  MGUS (monoclonal gammopathy of unknown significance) -She is on every 80-month follow-up for her MGUS. -Previous labs from June were stable. -Baseline 24-hour urine and bone scan were completed in January 2025.  No repeat scan or urine unless labs indicate. -Most recent M spike is 0.2, elevated kappa free light chain with normal kappa lambda light chain ratio.  LDH is within normal range. -Did discuss no crab criteria.  Calcium, hemoglobin and creatinine more or less stable.  No new bone pain. -Recheck MGUS labs in 6 months. Vitamin B12 deficiency disease She has been receiving B12 shots over the last 6 months or so. Most recent B12 levels are greater than 900. Recommend stopping B12 shots and continuing B12 orally 1000 mcg daily. Will recheck labs in 3 months and see if we need to add back and B12 shots.  Orders Placed This Encounter  Procedures   Iron  and TIBC ([facility] ONLY)    Standing Status:   Future    Expected Date:    12/25/2024    Expiration Date:   03/25/2025   Ferritin    Standing Status:   Future    Expected Date:   12/25/2024    Expiration Date:   03/25/2025   CBC with Differential    Standing Status:   Future    Expected Date:   12/25/2024    Expiration Date:   03/25/2025   Comprehensive metabolic panel with GFR    Standing Status:   Future    Expected Date:   12/25/2024    Expiration Date:   03/25/2025   Lactate dehydrogenase    Standing Status:   Future    Expected Date:   12/25/2024    Expiration Date:   03/25/2025   Vitamin B12    Standing Status:   Future    Expected Date:   12/25/2024    Expiration Date:   03/25/2025   Methylmalonic acid, serum    Standing Status:   Future    Expected Date:   12/25/2024    Expiration Date:   03/25/2025    INTERVAL HISTORY: Patient is a 74 year old female here for follow-up for iron  deficiency anemia. She received 1 g INFeD  on 02/27/2024 and she has been receiving monthly B12 shots since January 2025.  She has been tolerating both well.   She is taking iron  supplements daily.  She has not noticed any blood in her stools.  She eats a variety in her diet.  She denies any ice pica.  Energy levels are stable.  She feels pretty good.  Reports she sold her house and moved into a condo in [place].  Reports this is going well.  Has some chronic stable fatigue and energy levels are low.  She has shortness of breath with exertion.  She is planning on joining the [employer] next couple of weeks to help get herself out of the house.  She has some mild depression secondary to recent falling out with her daughter who lives in [place].  Reports she tries to stay active and do some sort of activity daily.  She recently had a bone density scan and was told she had osteoporosis.  She was started on Fosamax but she was unable to tolerate.  She is currently taking vitamin D and calcium tablets.  No recent fractures.   We reviewed CBC, CMP, LDH, myeloma labs, iron  panel and ferritin.  SUMMARY OF  HEMATOLOGIC HISTORY: Oncology History   No problem history exists.     CBC    Component Value Date/Time   WBC 8.4 09/20/2024 1211   RBC 4.59 09/20/2024 1211   HGB 13.8 09/20/2024 1211   HCT 41.9 09/20/2024 1211   PLT 124 (L) 09/20/2024 1211   MCV 91.3 09/20/2024 1211   MCH 30.1 09/20/2024 1211   MCHC 32.9 09/20/2024 1211   RDW 13.3 09/20/2024 1211   LYMPHSABS 1.9 09/20/2024 1211   MONOABS 0.5 09/20/2024 1211   EOSABS 0.2 09/20/2024 1211   BASOSABS 0.1 09/20/2024 1211       Latest Ref Rng & Units 09/20/2024   12:11 PM 06/26/2024   10:28 AM 03/16/2021    3:07 PM  CMP  Glucose 70 - 99 mg/dL 98  878  99   BUN 8 - 23 mg/dL 19  18  21    Creatinine 0.44 - 1.00 mg/dL 9.05  8.90  9.04   Sodium 135 - 145 mmol/L 142  144  137   Potassium 3.5 - 5.1 mmol/L 4.0  4.0  4.1   Chloride 98 - 111 mmol/L 104  104  104   CO2 22 - 32 mmol/L 24  24  26    Calcium 8.9 - 10.3 mg/dL 9.6  9.7  8.9   Total Protein 6.5 - 8.1 g/dL 7.1  6.8    Total Bilirubin 0.0 - 1.2 mg/dL 0.4  0.4    Alkaline Phos 38 - 126 U/L 74  78    AST 15 - 41 U/L 26  20    ALT 0 - 44 U/L 16  15       Lab Results  Component Value Date   FERRITIN 254 09/20/2024   VITAMINB12 935 (H) 09/20/2024    Vitals:   09/26/24 1124  BP: 133/67  Pulse: 64  Resp: 18  Temp: 98.7 F (37.1 C)  SpO2: 97%    Review of System:  Review of Systems  Constitutional:  Positive for malaise/fatigue. Negative for weight loss.  Respiratory:  Positive for shortness of breath.   Musculoskeletal:  Positive for joint pain.  Neurological:  Positive for dizziness.  Psychiatric/Behavioral:  Positive for depression. The patient has insomnia.     Physical Exam: Physical Exam Constitutional:      Appearance: Normal appearance.  HENT:     Head: Normocephalic and atraumatic.  Eyes:     Pupils: Pupils are equal, round, and reactive to light.  Cardiovascular:     Rate and Rhythm: Normal rate and regular rhythm.     Heart sounds: Normal heart  sounds.  No murmur heard. Pulmonary:     Effort: Pulmonary effort is normal.     Breath sounds: Normal breath sounds. No wheezing.  Abdominal:     General: Bowel sounds are normal. There is no distension.     Palpations: Abdomen is soft.     Tenderness: There is no abdominal tenderness.  Musculoskeletal:        General: Normal range of motion.     Cervical back: Normal range of motion.  Skin:    General: Skin is warm and dry.     Findings: No rash.  Neurological:     Mental Status: She is alert and oriented to person, place, and time.     Gait: Gait is intact.  Psychiatric:        Mood and Affect: Mood and affect normal.        Cognition and Memory: Memory normal.        Judgment: Judgment normal.      I spent 25 minutes dedicated to the care of this patient (face-to-face and non-face-to-face) on the date of the encounter to include what is described in the assessment and plan.,  Delon Hope, NP 09/26/2024 12:53 PM "

## 2024-09-26 NOTE — Assessment & Plan Note (Addendum)
-  She is on every 26-month follow-up for her MGUS. -Previous labs from June were stable. -Baseline 24-hour urine and bone scan were completed in January 2025.  No repeat scan or urine unless labs indicate. -Most recent M spike is 0.2, elevated kappa free light chain with normal kappa lambda light chain ratio.  LDH is within normal range. -Did discuss no crab criteria.  Calcium, hemoglobin and creatinine more or less stable.  No new bone pain. -Recheck MGUS labs in 6 months.

## 2024-09-26 NOTE — Assessment & Plan Note (Addendum)
-  Will continue to monitor at this time. -Slight decline in platelet count at 124,000.

## 2024-09-26 NOTE — Progress Notes (Signed)
 No b12 needed today per provider, B12 will be discontinued per provider.

## 2025-01-17 ENCOUNTER — Inpatient Hospital Stay

## 2025-01-24 ENCOUNTER — Inpatient Hospital Stay: Admitting: Oncology
